# Patient Record
Sex: Female | Born: 1950 | Race: White | Hispanic: No | Smoking: Former smoker
Health system: Southern US, Community
[De-identification: ages and names within clinical notes are randomized; demographics above are authoritative.]

## PROBLEM LIST (undated history)

## (undated) DIAGNOSIS — I10 Essential (primary) hypertension: Secondary | ICD-10-CM

## (undated) DIAGNOSIS — E785 Hyperlipidemia, unspecified: Secondary | ICD-10-CM

## (undated) DIAGNOSIS — K219 Gastro-esophageal reflux disease without esophagitis: Secondary | ICD-10-CM

## (undated) DIAGNOSIS — G4700 Insomnia, unspecified: Secondary | ICD-10-CM

## (undated) DIAGNOSIS — F329 Major depressive disorder, single episode, unspecified: Secondary | ICD-10-CM

## (undated) HISTORY — PX: APPENDECTOMY: SHX54

## (undated) HISTORY — PX: ABDOMINAL HYSTERECTOMY: SHX81

## (undated) HISTORY — PX: TONSILLECTOMY: SUR1361

## (undated) HISTORY — PX: UPPER GASTROINTESTINAL ENDOSCOPY: SHX188

## (undated) HISTORY — DX: Major depressive disorder, single episode, unspecified: F32.9

## (undated) HISTORY — DX: Insomnia, unspecified: G47.00

## (undated) HISTORY — DX: Hyperlipidemia, unspecified: E78.5

## (undated) HISTORY — PX: TENDON REPAIR: SHX5111

## (undated) HISTORY — PX: CHOLECYSTECTOMY: SHX55

## (undated) HISTORY — PX: TONSILLECTOMY: SHX5217

## (undated) HISTORY — DX: Gastro-esophageal reflux disease without esophagitis: K21.9

## (undated) HISTORY — PX: ORTHOPEDIC SURGERY: SHX850

---

## 2000-12-14 ENCOUNTER — Encounter: Payer: Self-pay | Admitting: Otolaryngology

## 2000-12-14 ENCOUNTER — Encounter: Admission: RE | Admit: 2000-12-14 | Discharge: 2000-12-14 | Payer: Self-pay | Admitting: Otolaryngology

## 2001-03-01 ENCOUNTER — Encounter: Payer: Self-pay | Admitting: Family Medicine

## 2001-03-01 ENCOUNTER — Ambulatory Visit (HOSPITAL_COMMUNITY): Admission: RE | Admit: 2001-03-01 | Discharge: 2001-03-01 | Payer: Self-pay | Admitting: Family Medicine

## 2002-05-12 ENCOUNTER — Encounter: Payer: Self-pay | Admitting: Obstetrics and Gynecology

## 2002-05-15 ENCOUNTER — Encounter (INDEPENDENT_AMBULATORY_CARE_PROVIDER_SITE_OTHER): Payer: Self-pay | Admitting: Specialist

## 2002-05-15 ENCOUNTER — Inpatient Hospital Stay (HOSPITAL_COMMUNITY): Admission: RE | Admit: 2002-05-15 | Discharge: 2002-05-17 | Payer: Self-pay | Admitting: Obstetrics and Gynecology

## 2002-05-16 ENCOUNTER — Encounter: Payer: Self-pay | Admitting: Obstetrics and Gynecology

## 2005-03-28 ENCOUNTER — Encounter: Admission: RE | Admit: 2005-03-28 | Discharge: 2005-03-28 | Payer: Self-pay | Admitting: Obstetrics and Gynecology

## 2005-07-11 ENCOUNTER — Ambulatory Visit: Payer: Self-pay | Admitting: Internal Medicine

## 2005-07-26 ENCOUNTER — Ambulatory Visit (HOSPITAL_COMMUNITY): Admission: RE | Admit: 2005-07-26 | Discharge: 2005-07-26 | Payer: Self-pay | Admitting: Internal Medicine

## 2005-07-26 ENCOUNTER — Ambulatory Visit: Payer: Self-pay | Admitting: Internal Medicine

## 2005-07-26 ENCOUNTER — Encounter (INDEPENDENT_AMBULATORY_CARE_PROVIDER_SITE_OTHER): Payer: Self-pay | Admitting: *Deleted

## 2006-05-10 ENCOUNTER — Encounter: Admission: RE | Admit: 2006-05-10 | Discharge: 2006-05-10 | Payer: Self-pay | Admitting: Obstetrics and Gynecology

## 2007-05-28 ENCOUNTER — Encounter: Admission: RE | Admit: 2007-05-28 | Discharge: 2007-05-28 | Payer: Self-pay | Admitting: Obstetrics and Gynecology

## 2008-06-30 ENCOUNTER — Encounter: Admission: RE | Admit: 2008-06-30 | Discharge: 2008-06-30 | Payer: Self-pay | Admitting: Obstetrics and Gynecology

## 2008-12-29 ENCOUNTER — Emergency Department (HOSPITAL_COMMUNITY): Admission: EM | Admit: 2008-12-29 | Discharge: 2008-12-29 | Payer: Self-pay | Admitting: Emergency Medicine

## 2008-12-29 ENCOUNTER — Encounter (INDEPENDENT_AMBULATORY_CARE_PROVIDER_SITE_OTHER): Payer: Self-pay | Admitting: *Deleted

## 2008-12-30 ENCOUNTER — Ambulatory Visit (HOSPITAL_COMMUNITY): Admission: RE | Admit: 2008-12-30 | Discharge: 2008-12-30 | Payer: Self-pay | Admitting: Family Medicine

## 2008-12-30 ENCOUNTER — Encounter: Payer: Self-pay | Admitting: Internal Medicine

## 2009-01-01 ENCOUNTER — Encounter (INDEPENDENT_AMBULATORY_CARE_PROVIDER_SITE_OTHER): Payer: Self-pay | Admitting: *Deleted

## 2009-01-29 ENCOUNTER — Ambulatory Visit: Payer: Self-pay | Admitting: Internal Medicine

## 2009-01-29 DIAGNOSIS — R932 Abnormal findings on diagnostic imaging of liver and biliary tract: Secondary | ICD-10-CM | POA: Insufficient documentation

## 2009-01-29 DIAGNOSIS — Z860100 Personal history of colon polyps, unspecified: Secondary | ICD-10-CM | POA: Insufficient documentation

## 2009-01-29 DIAGNOSIS — R1011 Right upper quadrant pain: Secondary | ICD-10-CM

## 2009-02-02 ENCOUNTER — Ambulatory Visit (HOSPITAL_COMMUNITY): Admission: RE | Admit: 2009-02-02 | Discharge: 2009-02-02 | Payer: Self-pay | Admitting: Internal Medicine

## 2009-07-26 ENCOUNTER — Encounter: Admission: RE | Admit: 2009-07-26 | Discharge: 2009-07-26 | Payer: Self-pay | Admitting: Obstetrics and Gynecology

## 2010-01-15 ENCOUNTER — Emergency Department (HOSPITAL_COMMUNITY)
Admission: EM | Admit: 2010-01-15 | Discharge: 2010-01-15 | Payer: Self-pay | Source: Home / Self Care | Admitting: Emergency Medicine

## 2010-02-16 ENCOUNTER — Ambulatory Visit: Payer: Self-pay | Admitting: Internal Medicine

## 2010-03-23 ENCOUNTER — Ambulatory Visit: Admit: 2010-03-23 | Payer: Self-pay | Admitting: Internal Medicine

## 2010-03-23 ENCOUNTER — Encounter (HOSPITAL_BASED_OUTPATIENT_CLINIC_OR_DEPARTMENT_OTHER): Payer: 59 | Admitting: Internal Medicine

## 2010-03-23 ENCOUNTER — Other Ambulatory Visit (INDEPENDENT_AMBULATORY_CARE_PROVIDER_SITE_OTHER): Payer: Self-pay | Admitting: Internal Medicine

## 2010-03-23 ENCOUNTER — Inpatient Hospital Stay (HOSPITAL_COMMUNITY): Admit: 2010-03-23 | Payer: Self-pay

## 2010-03-23 ENCOUNTER — Other Ambulatory Visit: Payer: Self-pay

## 2010-03-23 ENCOUNTER — Ambulatory Visit (HOSPITAL_COMMUNITY)
Admission: RE | Admit: 2010-03-23 | Discharge: 2010-03-23 | Disposition: A | Discharge status: Home / Self Care | Payer: 59 | Attending: Internal Medicine | Admitting: Internal Medicine

## 2010-03-23 DIAGNOSIS — Z860100 Personal history of colon polyps, unspecified: Secondary | ICD-10-CM | POA: Insufficient documentation

## 2010-03-23 DIAGNOSIS — K5730 Diverticulosis of large intestine without perforation or abscess without bleeding: Secondary | ICD-10-CM | POA: Insufficient documentation

## 2010-03-23 DIAGNOSIS — R112 Nausea with vomiting, unspecified: Secondary | ICD-10-CM | POA: Insufficient documentation

## 2010-03-23 DIAGNOSIS — K2960 Other gastritis without bleeding: Secondary | ICD-10-CM

## 2010-03-23 DIAGNOSIS — K644 Residual hemorrhoidal skin tags: Secondary | ICD-10-CM | POA: Insufficient documentation

## 2010-03-23 DIAGNOSIS — D126 Benign neoplasm of colon, unspecified: Secondary | ICD-10-CM | POA: Insufficient documentation

## 2010-03-23 DIAGNOSIS — K648 Other hemorrhoids: Secondary | ICD-10-CM | POA: Insufficient documentation

## 2010-03-23 DIAGNOSIS — K2940 Chronic atrophic gastritis without bleeding: Secondary | ICD-10-CM | POA: Insufficient documentation

## 2010-03-28 ENCOUNTER — Ambulatory Visit (HOSPITAL_COMMUNITY)
Admission: RE | Admit: 2010-03-28 | Discharge: 2010-03-28 | Disposition: A | Discharge status: Home / Self Care | Payer: 59 | Source: Ambulatory Visit | Attending: Family Medicine | Admitting: Family Medicine

## 2010-03-28 DIAGNOSIS — R109 Unspecified abdominal pain: Secondary | ICD-10-CM | POA: Insufficient documentation

## 2010-03-28 DIAGNOSIS — R112 Nausea with vomiting, unspecified: Secondary | ICD-10-CM | POA: Insufficient documentation

## 2010-03-28 DIAGNOSIS — K7689 Other specified diseases of liver: Secondary | ICD-10-CM | POA: Insufficient documentation

## 2010-04-04 ENCOUNTER — Other Ambulatory Visit (INDEPENDENT_AMBULATORY_CARE_PROVIDER_SITE_OTHER): Payer: Self-pay | Admitting: Internal Medicine

## 2010-04-04 DIAGNOSIS — R112 Nausea with vomiting, unspecified: Secondary | ICD-10-CM

## 2010-04-04 NOTE — Op Note (Signed)
NAMEADRIEANA, Janice Stephens               ACCOUNT NO.:  [id number]  MEDICAL RECORD NO.:  [id number]           PATIENT TYPE:  O  LOCATION:  DAY                           FACILITY:  [facility]  PHYSICIAN:  Lionel December, M.D.    DATE OF BIRTH:  Sep 07, 1950  DATE OF PROCEDURE:  03/23/2010 DATE OF DISCHARGE:                              OPERATIVE REPORT   PROCEDURE:  Diagnostic esophagogastroduodenoscopy and surveillance colonoscopy.  INDICATIONS:  Janice Stephens is 60 year old Caucasian female with unexplained nausea at times, worse with meals and sporadic vomiting, who in the past was also having chest pain, but cardiac evaluation as well as hepatobiliary ultrasound was negative.  She has chronic GERD and maintained on double dose PPI.  Her heartburn is well-controlled, but not her nausea.  She is therefore undergoing diagnostic EGD.  She states she has been treated for H. pylori gastritis in the past.  She has history of colonic polyps.  She had two adenomas removed by Dr. Jena Gauss in June 2007, and therefore undergoing surveillance colonoscopy. Procedures were reviewed with the patient.  Informed consent was obtained.  MEDS FOR CONSCIOUS SEDATION:  Cetacaine spray for oropharyngeal topical anesthesia, Demerol 50 mg IV, Versed 12 mg IV.  FINDINGS:  Procedure performed in endoscopy suite.  The patient's vital signs and O2 sat were monitored during the procedure and remained stable.  PROCEDURES: 1. Esophagogastroduodenoscopy.  The patient was placed in left lateral     recumbent position and Pentax videoscope was passed through     oropharynx without any difficulty into esophagus. 2. Esophagus.  Mucosa of the esophagus normal.  GE junction was     located at 37 cm from the incisors and was unremarkable. 3. Stomach.  It was empty and distended very well by insufflation.     Folds of proximal stomach are normal.  The gastric body, there was     patchy edema and erythema with few areas covered with specks  blood,     one such area was washed revealing underlying erosion.  Erosion was     approximately 4 x 8 mm.  Biopsy was taken from this area.  Antral     mucosa was normal.  Pyloric channel was patent.  Angularis, fundus,     and cardia were examined by retroflexion of the scope and were     normal. 4. Duodenum.  Bulbar mucosa was normal.  Scope was passed in second     part of duodenal mucosa and folds were normal.  Endoscope was     withdrawn.  The patient prepared for procedure #2. 5. Colonoscopy.  Rectal examination performed.  No abnormality noted     on external or digital exam.  Pentax videoscope was placed in     rectum and advanced under vision into sigmoid colon and beyond.     Preparation was excellent.  She had few small diverticula at the     transverse and ascending colon.  Scope was passed to the region of     ileocecal valve and cecum.  Cecal mucosa seen from a distance, but     could not  get closer to it.  Changing positions and withdrawing the     scope back all the way and starting again did not help.  As the     scope was withdrawn, colonic mucosa was carefully examined.  A 6-mm     polyp was snared from proximal transverse colon and 7-8 mm polyp on     a short stalk was snared from proximal sigmoid colon.  Rest of the     mucosa was normal.  Rectal mucosa was also normal.  Scope was     retroflexed to examine anorectal junction and hemorrhoids noted     above and below the dentate line.  Endoscope was then withdrawn.     Withdrawal time was over 40 minutes.  The patient tolerated the     procedure well.  FINAL DIAGNOSES: 1. Erosive gastritis.  Gastritis is primarily limited to gastric body,     biopsy taken for routine histology. 2. No evidence of pyloric stenosis or peptic ulcer disease. 3. Colonoscopy performed to cecum, but blunt end was not well seen.  A     6-mm polyp snared from transverse colon and 7-8 mm polyp snared     from proximal sigmoid colon. 4. Few  small diverticula at ascending and transverse colon. 5. Internal/external hemorrhoids.  RECOMMENDATIONS:  No aspirin for 10 days.  She will continue antireflux medicine and PPI.  She will also continue high-fiber diet, fiber supplement, and take Colace 2 tablets daily.  I will be contacting the patient with results of biopsy and further recommendations.     Lionel December, M.D.     NR/MEDQ  D:  03/23/2010  T:  03/23/2010  Job:  [id number]  cc:   Kirk Ruths, M.D. Fax: [phone]  Electronically Signed by Lionel December M.D. on 04/04/2010 11:14:49 AM

## 2010-04-07 ENCOUNTER — Encounter (HOSPITAL_COMMUNITY)
Admission: RE | Admit: 2010-04-07 | Discharge: 2010-04-07 | Disposition: A | Discharge status: Home / Self Care | Payer: 59 | Source: Ambulatory Visit | Attending: Internal Medicine | Admitting: Internal Medicine

## 2010-04-07 ENCOUNTER — Encounter (HOSPITAL_COMMUNITY): Payer: Self-pay

## 2010-04-07 DIAGNOSIS — R112 Nausea with vomiting, unspecified: Secondary | ICD-10-CM | POA: Insufficient documentation

## 2010-04-07 DIAGNOSIS — R109 Unspecified abdominal pain: Secondary | ICD-10-CM | POA: Insufficient documentation

## 2010-04-07 MED ORDER — TECHNETIUM TC 99M MEBROFENIN IV KIT
5.0000 | PACK | Freq: Once | INTRAVENOUS | Status: AC | PRN
  Administered 2010-04-07: 5.34 via INTRAVENOUS

## 2010-04-11 ENCOUNTER — Other Ambulatory Visit (INDEPENDENT_AMBULATORY_CARE_PROVIDER_SITE_OTHER): Payer: Self-pay | Admitting: Internal Medicine

## 2010-04-11 DIAGNOSIS — R1011 Right upper quadrant pain: Secondary | ICD-10-CM

## 2010-04-15 ENCOUNTER — Ambulatory Visit (HOSPITAL_COMMUNITY)
Admission: RE | Admit: 2010-04-15 | Discharge: 2010-04-15 | Disposition: A | Discharge status: Home / Self Care | Payer: 59 | Source: Ambulatory Visit | Attending: Internal Medicine | Admitting: Internal Medicine

## 2010-04-15 ENCOUNTER — Encounter (HOSPITAL_COMMUNITY): Payer: Self-pay

## 2010-04-15 DIAGNOSIS — R112 Nausea with vomiting, unspecified: Secondary | ICD-10-CM | POA: Insufficient documentation

## 2010-04-15 DIAGNOSIS — K5900 Constipation, unspecified: Secondary | ICD-10-CM | POA: Insufficient documentation

## 2010-04-15 DIAGNOSIS — R1011 Right upper quadrant pain: Secondary | ICD-10-CM | POA: Insufficient documentation

## 2010-04-15 MED ORDER — IOHEXOL 300 MG/ML  SOLN
100.0000 mL | Freq: Once | INTRAMUSCULAR | Status: AC | PRN
  Administered 2010-04-15: 100 mL via INTRAVENOUS

## 2010-04-19 ENCOUNTER — Ambulatory Visit (INDEPENDENT_AMBULATORY_CARE_PROVIDER_SITE_OTHER): Payer: 59 | Admitting: Internal Medicine

## 2010-04-25 ENCOUNTER — Ambulatory Visit (INDEPENDENT_AMBULATORY_CARE_PROVIDER_SITE_OTHER): Payer: 59 | Admitting: Internal Medicine

## 2010-04-25 DIAGNOSIS — R1011 Right upper quadrant pain: Secondary | ICD-10-CM

## 2010-04-25 DIAGNOSIS — R110 Nausea: Secondary | ICD-10-CM

## 2010-05-03 LAB — CBC
HCT: 40.2 % (ref 36.0–46.0)
Hemoglobin: 13 g/dL (ref 12.0–15.0)
RBC: 4.74 MIL/uL (ref 3.87–5.11)
WBC: 8.8 10*3/uL (ref 4.0–10.5)

## 2010-05-03 LAB — COMPREHENSIVE METABOLIC PANEL
ALT: 18 U/L (ref 0–35)
AST: 17 U/L (ref 0–37)
Alkaline Phosphatase: 99 U/L (ref 39–117)
CO2: 25 mEq/L (ref 19–32)
GFR calc non Af Amer: >60 mL/min (ref >60)
Glucose, Bld: 92 mg/dL (ref 70–99)
Potassium: 3.2 mEq/L — ABNORMAL LOW (ref 3.5–5.1)
Sodium: 139 mEq/L (ref 135–145)

## 2010-05-03 LAB — POCT CARDIAC MARKERS
CKMB, poc: 1.1 ng/mL (ref 1.0–8.0)
Troponin i, poc: <0.05 ng/mL (ref 0.00–0.09)

## 2010-05-04 ENCOUNTER — Other Ambulatory Visit: Payer: Self-pay | Admitting: General Surgery

## 2010-05-04 ENCOUNTER — Encounter (HOSPITAL_COMMUNITY): Payer: 59

## 2010-05-04 LAB — BASIC METABOLIC PANEL
Calcium: 9.5 mg/dL (ref 8.4–10.5)
GFR calc Af Amer: >60 mL/min (ref >60)
GFR calc non Af Amer: >60 mL/min (ref >60)
Glucose, Bld: 98 mg/dL (ref 70–99)
Potassium: 3.6 mEq/L (ref 3.5–5.1)
Sodium: 141 mEq/L (ref 135–145)

## 2010-05-04 LAB — HEPATIC FUNCTION PANEL
ALT: 19 U/L (ref 0–35)
AST: 17 U/L (ref 0–37)
Albumin: 4.2 g/dL (ref 3.5–5.2)
Bilirubin, Direct: 0.2 mg/dL (ref 0.0–0.3)
Total Protein: 6.7 g/dL (ref 6.0–8.3)

## 2010-05-04 LAB — CBC
HCT: 42 % (ref 36.0–46.0)
Hemoglobin: 14.2 g/dL (ref 12.0–15.0)
RDW: 13.5 % (ref 11.5–15.5)
WBC: 10.2 10*3/uL (ref 4.0–10.5)

## 2010-05-04 LAB — SURGICAL PCR SCREEN: MRSA, PCR: NEGATIVE

## 2010-05-05 NOTE — H&P (Signed)
  NAME:  Janice Stephens, Janice Stephens               ACCOUNT NO.:  [id number]  MEDICAL RECORD NO.:  [id number]           PATIENT TYPE:  O  LOCATION:  DAY                           FACILITY:  [facility]  PHYSICIAN:  Dalia Heading, M.D.  DATE OF BIRTH:  1950-05-30  DATE OF ADMISSION: DATE OF DISCHARGE:  LH                             HISTORY & PHYSICAL   CHIEF COMPLAINT:  Chronic cholecystitis.  HISTORY OF PRESENT ILLNESS:  The patient is a 60 year old white female who is referred for evaluation and treatment of biliary colic.  She has been having intermittent right upper quadrant abdominal pain with radiation to the flank, nausea, and indigestion for the past few months. An extensive workup has been done and is remarkable only for reproducible symptoms with CCK injection.  It is made worse with fatty foods.  No fever, chills, jaundice have been noted.  PAST MEDICAL HISTORY:  Reflux disease.  PAST SURGICAL HISTORY:  Appendectomy, knee surgeries, elbow surgery, hysterectomy, tonsillectomy.  CURRENT MEDICATIONS:  Wellbutrin, alprazolam, Prevacid, Zofran.  ALLERGIES:  No known drug allergies.  REVIEW OF SYSTEMS:  The patient denies drinking or significant alcohol use.  She denies any other cardiopulmonary difficulties or bleeding disorders.  FAMILY MEDICAL HISTORY:  Noncontributory.  PHYSICAL EXAMINATION:  GENERAL:  The patient is a well-developed, well- nourished white female in no acute distress. HEENT:  Reveals no scleral icterus. LUNGS:  Clear to auscultation with equal breath sounds bilaterally. HEART:  Reveals regular rate and rhythm without S3, S4, murmurs. ABDOMEN:  Soft and nondistended.  She is tender in the right upper quadrant to palpation.  No hepatosplenomegaly, masses, hernias are identified.  Ultrasound of the gallbladder is negative.  HIDA scan reveals a normal ejection fraction but reproducible symptoms with CCK.  IMPRESSION:  Chronic cholecystitis.  PLAN:  The patient is  scheduled for laparoscopic cholecystectomy on May 09, 2010.  The risks and benefits of the procedure including bleeding, infection, hepatobiliary injury, the possibility of an open procedure were fully explained to the patient, gave informed consent. She fully understands that this procedure may not totally resolve her symptoms.     Dalia Heading, M.D.     MAJ/MEDQ  D:  05/03/2010  T:  05/04/2010  Job:  [id number]  cc:   Lionel December, M.D. Fax: [phone]  Kirk Ruths, M.D. Fax: [phone]  Electronically Signed by [name] M.D. on 05/05/2010 10:59:11 AM

## 2010-05-09 ENCOUNTER — Other Ambulatory Visit: Payer: Self-pay | Admitting: General Surgery

## 2010-05-09 ENCOUNTER — Ambulatory Visit (HOSPITAL_COMMUNITY)
Admission: RE | Admit: 2010-05-09 | Discharge: 2010-05-09 | Disposition: A | Discharge status: Home / Self Care | Payer: 59 | Source: Ambulatory Visit | Attending: General Surgery | Admitting: General Surgery

## 2010-05-09 DIAGNOSIS — Z01818 Encounter for other preprocedural examination: Secondary | ICD-10-CM | POA: Insufficient documentation

## 2010-05-09 DIAGNOSIS — K811 Chronic cholecystitis: Secondary | ICD-10-CM | POA: Insufficient documentation

## 2010-05-09 DIAGNOSIS — Z01812 Encounter for preprocedural laboratory examination: Secondary | ICD-10-CM | POA: Insufficient documentation

## 2010-05-10 NOTE — Op Note (Signed)
[name], Janice Stephens               ACCOUNT NO.:  [id number]  MEDICAL RECORD NO.:  [id number]           PATIENT TYPE:  O  LOCATION:  DAYP                          FACILITY:  [facility]  PHYSICIAN:  Dalia Heading, M.D.  DATE OF BIRTH:  1950-03-18  DATE OF PROCEDURE:  05/09/2010 DATE OF DISCHARGE:                              OPERATIVE REPORT   PREOPERATIVE DIAGNOSIS:  Chronic cholecystitis.  POSTOPERATIVE DIAGNOSIS:  Chronic cholecystitis.  PROCEDURE:  Laparoscopic cholecystectomy.  SURGEON:  Dalia Heading, MD  ANESTHESIA:  General endotracheal.  INDICATIONS:  The patient is a 60 year old white female who was referred for treatment of biliary colic.  The risks and benefits of the procedure including bleeding, infection, hepatobiliary injury, and the possibility of an open procedure were fully explained to the patient, gave informed consent.  PROCEDURE NOTE:  The patient was placed in supine position.  After induction of general endotracheal anesthesia, the abdomen was prepped and draped in the usual sterile technique with DuraPrep.  Surgical site confirmation was performed.  An infraumbilical incision was made down to the fascia.  A Veress needle was introduced into the abdominal cavity and confirmation of placement was done using the saline drop test.  The abdomen was then insufflated to 16 mmHg pressure.  An 11-mm trocar was introduced into the abdominal cavity under direct visualization without difficulty.  The patient was placed in reversed Trendelenburg position.  Additional 11-mm trocar was placed epigastric region and 5-mm trocars were placed in the right upper quadrant and right flank regions.  Liver was inspected and noted to be within normal limits.  The gallbladder was retracted superiorly and laterally.  The dissection was begun around the infundibulum of the gallbladder.  The cystic duct was first identified.  Its juncture to the infundibulum were fully  identified.  EndoClips were placed proximally and distally on the cystic duct and cystic duct was divided.  This likewise done on the cystic artery.  The gallbladder was then freed away from the gallbladder fossa using Bovie electrocautery.  The gallbladder was delivered through the epigastric trocar site using EndoCatch bag. The gallbladder fossa was inspected.  No abnormal bleeding or bile leakage was noted.  Surgicel was placed in the gallbladder fossa.  All fluid and air were then evacuate from the abdominal cavity prior to removal of the trocars.  All wounds were irrigated with normal saline.  All wounds were checked with 0.5 cm Sensorcaine.  The infraumbilical fascia was reapproximated using an 0 Vicryl interrupted suture.  All skin incisions were closed using staples.  Betadine ointment, dry sterile dressings were applied.  All tape and needle counts correct at the end of the procedure.  The patient was extubated in the operating room and went back to recovery room, awake in stable condition.  COMPLICATIONS:  None.  SPECIMEN:  Gallbladder.  BLOOD LOSS:  Minimal.     Dalia Heading, M.D.     MAJ/MEDQ  D:  05/09/2010  T:  05/09/2010  Job:  [id number]  cc:   Kirk Ruths, M.D. Fax: [phone]  Lionel December, M.D. Fax: [phone]  Electronically Signed  by [name] M.D. on 05/10/2010 09:01:21 AM

## 2010-05-25 LAB — COMPREHENSIVE METABOLIC PANEL
Albumin: 3.8 g/dL (ref 3.5–5.2)
Alkaline Phosphatase: 84 U/L (ref 39–117)
BUN: 13 mg/dL (ref 6–23)
CO2: 23 mEq/L (ref 19–32)
Chloride: 106 mEq/L (ref 96–112)
Creatinine, Ser: 0.76 mg/dL (ref 0.4–1.2)
GFR calc non Af Amer: >60 mL/min (ref >60)
Glucose, Bld: 108 mg/dL — ABNORMAL HIGH (ref 70–99)
Potassium: 3.7 mEq/L (ref 3.5–5.1)
Total Bilirubin: 0.8 mg/dL (ref 0.3–1.2)

## 2010-05-25 LAB — CBC
HCT: 40.5 % (ref 36.0–46.0)
Hemoglobin: 14 g/dL (ref 12.0–15.0)
MCV: 87 fL (ref 78.0–100.0)
RBC: 4.65 MIL/uL (ref 3.87–5.11)
WBC: 8.1 10*3/uL (ref 4.0–10.5)

## 2010-05-25 LAB — POCT CARDIAC MARKERS
CKMB, poc: <1 ng/mL — ABNORMAL LOW (ref 1.0–8.0)
Myoglobin, poc: 44.7 ng/mL (ref 12–200)

## 2010-05-25 LAB — LIPASE, BLOOD: Lipase: 24 U/L (ref 11–59)

## 2010-05-25 LAB — DIFFERENTIAL
Basophils Absolute: 0.1 10*3/uL (ref 0.0–0.1)
Basophils Relative: 2 % — ABNORMAL HIGH (ref 0–1)
Lymphocytes Relative: 24 % (ref 12–46)
Monocytes Absolute: 0.1 10*3/uL (ref 0.1–1.0)
Neutro Abs: 5.6 10*3/uL (ref 1.7–7.7)

## 2010-06-13 ENCOUNTER — Other Ambulatory Visit (INDEPENDENT_AMBULATORY_CARE_PROVIDER_SITE_OTHER): Payer: Self-pay | Admitting: Internal Medicine

## 2010-06-13 DIAGNOSIS — R112 Nausea with vomiting, unspecified: Secondary | ICD-10-CM

## 2010-06-20 ENCOUNTER — Other Ambulatory Visit: Payer: Self-pay | Admitting: Obstetrics and Gynecology

## 2010-06-20 DIAGNOSIS — Z1231 Encounter for screening mammogram for malignant neoplasm of breast: Secondary | ICD-10-CM

## 2010-07-08 NOTE — Discharge Summary (Signed)
NAME:  Janice Stephens, Janice Stephens                         ACCOUNT NO.:  [id number]   MEDICAL RECORD NO.:  [id number]                   PATIENT TYPE:  INP   LOCATION:  0477                                 FACILITY:  [facility]   PHYSICIAN:  Malachi Pro. Ambrose Mantle, M.D.              DATE OF BIRTH:  1950/08/06   DATE OF ADMISSION:  05/15/2002  DATE OF DISCHARGE:  05/17/2002                                 DISCHARGE SUMMARY   HISTORY OF PRESENT ILLNESS:  This is a 60 year old white female who is  admitted for abdominal hysterectomy, bilateral salpingo-oophorectomy,  because of menorrhagia, dysmenorrhea, abnormal uterine bleeding, and  fibroids.  At the time of laparotomy the patient was noted to have adhesions  of her omentum to her abdominal wall and the sigmoid colon to the abdominal  wall.  She was also noted to have pelvic endometriosis along the left ureter  and lateral to the right uterosacral ligament, and an enlarged appendix.   HOSPITAL COURSE:  She underwent abdominal hysterectomy, bilateral salpingo-  oophorectomy, and appendectomy by Dr. Ambrose Mantle with Dr. Senaida Ores assisting  under general anesthesia.  Dr. Derrell Lolling was called in on consultation to see  if he though the appendix should be removed, and he agreed that it should be  removed.  Postoperatively, the patient did quite well.  She did have a  temperature elevation to 100.5 degrees at 4 p.m. and 8 p.m. on the first  postoperative day.  Her only complaint was some pain at the lower right rib  cage, and a chest x-ray showed no acute infiltrate and showed air under the  right hemidiaphragm compatible with recent surgery, and that was considered  to be the cause of her pain.  An urine specimen was normal.  Her abdomen was  soft and nontender.  Incision looked like it was healing well.  No sign of  infection.  She had passed some flatus, and on the second postoperative day  she was ready for discharge.   LABORATORY DATA:  Admission hemoglobin  14.1, hematocrit 40.4, white blood  cell count 7700, platelet count 235,000.  Followup hematocrits were 36.9 and  37, 68 segs, 24 lymphs, 4 monos, 3 eosinophils.  Comprehensive metabolic  profile was normal.  Urinalysis on admission was unremarkable, but a cathed  urine for evaluation of low-grade fever on 05/16/02, showed no abnormalities.  A culture is pending.  Chest x-ray on admission showed no active disease.  A  chest x-ray to evaluate a low-grade fever on the first postoperative day  showed no acute infiltrate, but air under the right hemidiaphragm compatible  with recent surgery.  EKG was normal.  Pathology report is not available at  this time.   FINAL DIAGNOSES:  1. Menorrhagia.  2. Dysmenorrhea.  3. Abnormal uterine bleeding.  4. Abdominal adhesions of unknown origin unless secondary to her previous     tubal surgery.  5. Probable fecalith  of the appendix.  6. Pelvic endometriosis.   Pathology report is pending.   OPERATION:  Abdominal hysterectomy, bilateral salpingo-oophorectomy,  appendectomy, and division of abdominal adhesions.   CONDITION ON DISCHARGE:  Improved.   DISCHARGE INSTRUCTIONS:  1. Our regular discharge instructions.  2. No intercourse, no vaginal entrance, no heavy lifting or strenuous     activity.  3. Watch her temperature.  4. Call with any fever above 100.6 degrees.  Call with any heavy vaginal     bleeding.  5. Concentrate on a liquid diet until freely passing flatus or having bowel     movements.   FOLLOWUP:  Return to the office in two to three days for staple removal.   DISCHARGE MEDICATIONS:  Percocet 5/325 mg #16 tablets one q.4-6h. p.r.n.  pain.                                                Malachi Pro. Ambrose Mantle, M.D.    TFH/MEDQ  D:  05/17/2002  T:  05/17/2002  Job:  [id number]

## 2010-07-08 NOTE — H&P (Signed)
NAME:  Janice Stephens, Janice Stephens                         ACCOUNT NO.:  [id number]   MEDICAL RECORD NO.:  [id number]                   PATIENT TYPE:  INP   LOCATION:  NA                                   FACILITY:  [facility]   PHYSICIAN:  Malachi Pro. Ambrose Mantle, M.D.              DATE OF BIRTH:  1951/02/01   DATE OF ADMISSION:  05/15/2002  DATE OF DISCHARGE:                                HISTORY & PHYSICAL   PRESENT ILLNESS:  This is a 60 year old white married female, para 0, who is  admitted to the hospital for abdominal hysterectomy, bilateral salpingo-  oophorectomy because of menorrhagia, dysmenorrhea, fibroids, abnormal  uterine bleeding.  This patient was seen on April 30, 2002.  At that time  she gave a history that her periods were extremely painful.  They were  irregular.  There was extremely heavy bleeding.  She claimed that on  March 26, 2002, she had had a period that lasted three to four days of  heavy flow, passing blood clots the size of the palm of her hand.  By  history, she said that her menarche was at age 50-18.  She had two periods  per year until her late 30s, but over the last 10-12 years she had had  regular periods.  One year ago she began having heavier periods every 30  days.  She would spot two days, bleed heavily for four to five days, and  then spot two more days.  She rated the pain as 8/10.  She would go through  a heavy pad at night, and she would palm-sized clots.  On examination her  uterus could be felt in the lower part of her abdomen and by pelvic exam was  thought to be about 10-12 weeks' size.  Because of the abnormal uterine  bleeding, a biopsy was done which showed weakly secretory endometrium.  No  evidence of hyperplasia or malignancy.  She is admitted now for abdominal  hysterectomy, bilateral salpingo-oophorectomy.  She was offered Lupron,  possible D&C.  She chose to proceed with definitive therapy.   ALLERGIES:  SULFA.   MEDICATIONS:  Ambien on a  p.r.n. basis.  Zyrtec.  Celexa.   OPERATION:  1. Bilateral arthroscopic knee surgery.  2. Right elbow surgery for tendinitis.  3. Tubal ligation.  4. Tonsillectomy.   PAST MEDICAL HISTORY:  Arthritis of the spine.  No heart problems.  In  January 2003, she did have dysplasia of the cervix.   TOBACCO/ALCOHOL:  No alcohol.  Tobacco use, one pack per day.   REVIEW OF SYSTEMS:  She does have headaches.  She has urinary frequency.   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   FAMILY HISTORY:  Mother 68 with heart problems  and intestinal problems.  Father 67 with heart problems, melanoma, and also diabetic.  No sisters.  One brother living and well.   PHYSICAL EXAMINATION:  GENERAL:  Well-developed, well-nourished white  female.  VITAL SIGNS:  Weight 137 pounds.  Blood pressure 120/80, pulse of 78.  HEENT:  No cranial abnormalities.  Extraocular movements intact.  Nose and  pharynx clear.  NECK:  Supple.  Without thyromegaly.  HEART:  Normal size and sounds.  No murmurs.  LUNGS:  Clear to P&A.  BREASTS:  Soft, without masses.  ABDOMEN:  Soft.  There is a lower abdominal mass somewhat imprecise as to  how far into the lower abdomen it comes.  The vulva and vagina show  menstrual flow.  A Pap smear on April 30, 2002, was negative for  intraepithelial lesion, and it was satisfactory for evaluation.  The cervix  is clean.  The uterus is anterior, thought to be 12-14 weeks' size.  It is  tender to motion.  The adnexae are free of masses.  RECTAL:  Negative.   ADMISSION IMPRESSION:  1. Leiomyomata uteri.  2. Menorrhagia.  3. Dysmenorrhea.  4. Abnormal uterine bleeding.   PLAN:  The patient is admitted for abdominal hysterectomy and bilateral  salpingo-oophorectomy.  She has been informed of the risks of surgery   including but not limited to heart attack, stroke, pulmonary embolus, wound  disruption, hemorrhage with need for reoperation and/or transfusion, fistula  formation, nerve injury, intestinal obstruction.  She understands and agrees  to proceed with surgery.  She has also been informed that the surgery could  have an unpredictable impact on her sex drive.  She understands that.  She  states that her sex drive has been low recently because of the extremely  draining nature of her periods and the severe pain she experiences with the  periods.  She understands that there may be a loss of sex drive and that if  she does experience a loss of sex drive there is not an absolute cure for  that condition.  She understands and agrees to proceed.                                               Malachi Pro. Ambrose Mantle, M.D.    TFH/MEDQ  D:  05/15/2002  T:  05/15/2002  Job:  [id number]

## 2010-07-08 NOTE — Op Note (Signed)
NAME:  Janice Stephens, Janice Stephens               ACCOUNT NO.:  [id number]   MEDICAL RECORD NO.:  [id number]          PATIENT TYPE:  AMB   LOCATION:  DAY                           FACILITY:  [facility]   PHYSICIAN:  R. Roetta Sessions, M.D. DATE OF BIRTH:  1950-11-02   DATE OF PROCEDURE:  07/26/2005  DATE OF DISCHARGE:                                 OPERATIVE REPORT   PROCEDURES:  1.  EGD.  2.  Colonoscopy with snare polypectomy.   INDICATIONS FOR PROCEDURE:  The patient is a 60 year old lady with  longstanding gastroesophageal reflux disease symptoms.  She has been on over-  the-counter agents, including Prilosec and Zantac.  She took Prevpac for  positive Helicobacter pylori serologies.  While on Prevpac therapy, she had  significant improvement in her reflux symptoms.  She was given Prevacid  Solutabs through our office, but has not yet started them.  She has had  intermittent hematochezia.  EGD and colonoscopy are now being done.  This  procedure has been discussed with the patient at length.  The potential  risks, benefits, and alternatives have been reviewed.  Questions answered.  She is agreeable.  Please see documentation in the medical record.   PROCEDURE NOTE:  O2 saturation, blood pressure, and pulse and respirations  were monitored throughout the entire procedure.   CONSCIOUS SEDATION:  1.  IV Demerol and Versed in incremental doses.  2.  Cetacaine spray for topical pharyngeal anesthesia.   FINDINGS:  EGD examination of the tubular esophagus revealed no mucosal  abnormalities.  EG junction was easily traversed.   Stomach:  Gastric cavity was empty.  It insufflated well with air.  Thorough  examination of the gastric mucosa, including retroflexed view of the  proximal stomach and esophagogastric junction demonstrated a small hiatal  hernia and mottled, erythematous-appearing gastric mucosa involving  primarily the body.  No infiltrating process, erosion, or ulcer crater, or  other  abnormality was seen.  Pylorus was patent and easily traversed.  Examination of the bulb and second portion revealed no abnormalities.   THERAPEUTIC/DIAGNOSTIC MANEUVERS PERFORMED:  None.   The patient tolerated the procedure well and was prepared for colonoscopy.   Digital exam revealed no abnormalities.  Endoscopic findings:  Prep was  adequate .  Rectum:  Examination of the rectum mucosa including retroflexed  view of the anal verge revealed somewhat engorged internal hemorrhoids and a  7 mm polyp 10 cm from the anal verge.  Rectal mucosa otherwise appeared  normal.   Colon:  Colonic mucosa was surveyed from the rectosigmoid junction, through  the left, transverse, and right colon, to the appendiceal orifice, ileocecal  valve, and cecum.  These structures were well seen and photographed for the  record.  From this level, the scope was slowly withdrawn.  All previously  mentioned mucosal surfaces were again seen.  The patient had a 1 cm somewhat  angry-appearing polyp at 35 cm.  It was pedunculated, and she had also  pancolonic diverticula.  The remainder of the colonic mucosa appeared  normal.  The polyp at 35 cm was removed with a  snare and cautery and  recovered through the scope.  The polyp at 10 cm was also removed with snare  cautery and recovered through the scope.  The patient tolerated both  procedures well and was reactivated.   ENDOSCOPIC IMPRESSION:   EGD:  1.  Normal esophagus.  2.  Small hiatal hernia, patchy, mottled erythema of the gastric body.      Otherwise normal-appearing gastric mucosa.  Patent pylorus, duodenal      bulb, D1, D2.   COLONOSCOPY FINDINGS:  1.  Polyps in the rectum and sigmoid colon, as described above, status post      removal with snare.  2.  Internal hemorrhoids, engorged.  Pancolonic diverticula.  The remainder      of the colonic mucosa appeared normal.   I suspect the patient has bled from hemorrhoids.   RECOMMENDATIONS:  1.  No  aspirin or __________ medications for the next 10 days.  2.  Diverticulosis literature given to [name].  3.  A 10-day course of Anusol-HC suppositories, 1 per rectum at bedtime.  4.  Follow up on pathology.  5.  Further recommendations to follow.      Jonathon Bellows, M.D.  Electronically Signed     RMR/MEDQ  D:  07/26/2005  T:  07/26/2005  Job:  [id number]   cc:   Kirk Ruths, M.D.  Fax: [phone]

## 2010-07-08 NOTE — Op Note (Signed)
NAME:  Janice Stephens, Janice Stephens                         ACCOUNT NO.:  [id number]   MEDICAL RECORD NO.:  [id number]                   PATIENT TYPE:  INP   LOCATION:  0010                                 FACILITY:  [facility]   PHYSICIAN:  Malachi Pro. Ambrose Mantle, M.D.              DATE OF BIRTH:  1950-11-02   DATE OF PROCEDURE:  05/15/2002  DATE OF DISCHARGE:                                 OPERATIVE REPORT   PREOPERATIVE DIAGNOSES:  1. Menorrhagia.  2. Dysmenorrhea.  3. Abnormal uterine bleeding.  4. Leiomyomata uteri.   POSTOPERATIVE DIAGNOSES:  1. Menorrhagia.  2. Dysmenorrhea.  3. Abnormal uterine bleeding.  4. Leiomyomata uteri.  5. Adhesions of the omentum to the abdominal wall and the sigmoid colon to     the abdominal wall.  6. Pelvic endometriosis.  7. Enlarged appendix.   OPERATION:  1. Abdominal hysterectomy.  2. Bilateral salpingo-oophorectomy.  3. Lysis of adhesions.  4. Appendectomy.   CONSULT:  Angelia Mould. Derrell Lolling, M.D.   OPERATOR:  Malachi Pro. Ambrose Mantle, M.D.   ANESTHESIA:  General.   DESCRIPTION OF PROCEDURE:  The patient was brought to the operating room and  placed under satisfactory general anesthesia and placed in the frogleg  position.  The abdomen, vulva, vagina, and urethra were prepped with  Betadine solution.  A Foley catheter was inserted to straight drain.  Exam  revealed the uterus to be about 12 weeks size, anterior, and quite firm.  There were no adnexal masses.  The patient was placed supine.  The abdomen  was entered by a transverse incision that was carried in layers through the  skin, subcutaneous tissue, and fascia.  The fascia was separated from the  rectus muscles superiorly and inferiorly.  Rectus muscle was split in the  midline and opened vertically.  Exploration of the upper abdomen revealed  the gallbladder to be hard to feel.  I was not sure I ever felt it.  The  liver was smooth.  Both kidneys felt normal.  There were no definite upper  abdominal  abnormalities; however, the sigmoid colon was adherent to the left  lower abdominal wall.  These adhesions were divided.  There were also  omental adhesions to the abdominal wall at about the level of the umbilicus  and above.  I took down the adhesions I could get to, then explored the  pelvis.  The uterus was what I thought was 2-1/2 to 3 times normal size and  extremely firm.  There was a leiomyoma in the right broad ligament close to  the uterus.  The uterus itself had some fibroids, but the main enlargement  might have been due to adenomyosis.  There was some nodularity from  endometriosis lateral to the right uterosacral ligament, adhering the rectum  to the posterior cul-de-sac below the uterosacral ligaments at their  junction to the uterus.  There was also endometriosis in at least two  locations in the area of the left ureter.  Both tubes showed evidence of  tubal ligation, and the ovaries appeared normal.  Packs and retractor were  used for exposure.  The upper pedicles of the uterus were clamped across,  and the uterus was brought up into the operative field.  Both round  ligaments were divided with the Bovie, and the infundibulopelvic ligaments  were divided between clamps and doubly suture ligated.  The uterine vessels  bilaterally were skeletonized, clamped, cut, and suture ligated.  The  parametrial tissues were then clamped, cut, and suture ligated, and the  uterosacral ligaments were clamped, cut, suture ligated, then held.  The  right vaginal angle was entered as was the left, and then the uterus,tubes,  and ovaries were removed by transecting the upper vagina.  Vaginal angled  sutures were placed, and then the central portion of the cuff was closed  with interrupted figure-of-eight sutures of 0 Vicryl.  Hemostasis was  achieved.  Liberal irrigation confirmed hemostasis.  The uterosacral  ligaments were sutured together in the midline with two sutures of 0 Vicryl.   Reperitonealization was then done across the vaginal cuff.  Both ureters  were identified and found to be normal in course and caliber.  It was  elected not to remove the endometriosis lateral to the right uterosacral  ligament or the endometriosis over the left ureter since the ovaries were  removed.  The appendix had been noted to be enlarged in its mid portion.  The proximal part of the appendix was normal in size and shape, and the very  distal-most part was normal.  However, intervening was a very firm appendix  that was slightly discolored and was thought to represent maybe either a  fecalith or possibly endometriosis.  We did call Angelia Mould. Derrell Lolling, M.D. to  the operating room.  He looked at the appendix and felt that it should be  removed.  I had divided the mesoappendix with multiple bites and sutured it  with 3-0 Vicryl suture and tied with 2-0 Vicryl.  I then clamped across the  base of the appendix, creating a crushed area, put a 0 chromic tie across  this area, placed a clamp just proximal to it and divided the appendix  between the clamp and the tie.  I then buried the appendiceal stump beneath  a pursestring suture of 3-0 black silk.  I did touch the open lumen of the  appendix with a Bovie.  Hemostasis was complete.  Reinspection of the pelvis  revealed no abnormalities, no bleeding.  Packs and retractors were  completely removed.  The peritoneum was closed with a running suture of 0  Vicryl, rectus muscle reapproximated with interrupted 0 Vicryl, fascia with  two running sutures of 0 Vicryl, subcutaneous tissue with a running 3-0  Vicryl, and the skin was closed with automatic staples.  The patient seemed  to tolerate the procedure well.  Blood loss was about 75 mL according to the  anesthetist, and the patient was returned to recovery in satisfactory  condition.                                               Malachi Pro. Ambrose Mantle, M.D.   TFH/MEDQ  D:  05/15/2002  T:   05/16/2002  Job:  [id number]

## 2010-07-08 NOTE — Consult Note (Signed)
NAME:  PIERA, DOWNS               ACCOUNT NO.:  [id number]   MEDICAL RECORD NO.:  [id number]          PATIENT TYPE:  AMB   LOCATION:                                FACILITY:  [facility]   PHYSICIAN:  R. Roetta Sessions, M.D. DATE OF BIRTH:  1950/08/02   DATE OF CONSULTATION:  DATE OF DISCHARGE:                                   CONSULTATION   GASTROINTESTINAL CONSULT   REQUESTING PHYSICIAN:  Dr. Regino Schultze.   REASON FOR CONSULTATION:  Chronic refractory GERD/hoarseness.   HISTORY OF PRESENT ILLNESS:  Mrs. Mccorvey is a 60 year old Caucasian female  who was referred from Dr. Edison Simon office.  She states she has a several-  year history of chronic GERD symptoms including heartburn, water brash and  daily indigestion.  She complained of chronic hoarseness.  She was seen by  Melony Overly, PA-C and found of the H. pylori positive.  She was given a  Prevpac.  She states that this completely resolved her symptoms; however, 2  days after the discontinuation of the Prevpac, she began to have recurrent  symptoms as above.  She then started over-the-counter Prilosec 20 mg daily,  which did not seem to help her symptoms.  They are definitely made worse by  tomatoes, coffee, chocolate, caffeine, et Karie Soda.  Prior to this, she had  been taking Zantac over-the-counter a couple per day for about a month.  She  denies any dysphagia or odynophagia although she does report occasional  __________  sensation.  She denies any anorexia or abdominal pain.  She has  had normal to sometimes hard bowel movements every other day to every 3  days.  She does use an over-the-counter stool softener, which seems to work  well for her.  She has noticed a small to moderate amount of rectal bleeding  frequently over the last couple of years.  She denies any history of  diarrhea.   PAST MEDICAL HISTORY:  1.  Ankylosing spondylitis.  2.  Depression.  3.  Chronic GERD.  4.  Hypercholesterolemia.  5.  Tonsillectomy.  6.   Bilateral knee surgeries.  7.  Hysterectomy.  8.  Right elbow surgery.   CURRENT MEDICATIONS:  1.  Over-the-counter Equate brand allergy medicines, 2 at bedtime.  2.  Calcium 600 mg of vitamin D once daily.  3.  Centrum multivitamin daily.  4.  Premarin 0.625 mg daily.  5.  Prilosec 20 mg daily.  6.  Citalopram 40 mg daily.  7.  She completed a Prevpac 2 months ago.   ALLERGIES:  SULFA.   FAMILY HISTORY:  Positive adenomatous polyps in both her mother and father  who are age 19, and 110.  Her mother also has a history of hypertension and  depression.  Her father has a history of MI and melanoma, and diabetes  mellitus.  She has 1 healthy brother.   SOCIAL HISTORY:  Mrs. Klinedinst has been married for 7 years.  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  She reports a remote history of  tobacco use, quitting 1 year ago.  She denies any alcohol or drug use.   REVIEW OF SYSTEMS:  CONSTITUTIONAL:  Weight is stable, denies any fever and  does complain of occasional chills.  CARDIOVASCULAR:  She reports a normal  cardiac workup within the last year with a normal stress test.  PULMONARY:  Denies any shortness of breath dyspnea, cough or hemoptysis.  GI:  See HPI.   PHYSICAL EXAMINATION:  VITAL SIGNS:  Weight 139 pounds, height 52 inches,  temp 97.6, blood pressure 128/84 and pulse 72.  GENERAL:  Mrs. Shepler is a 60 year old well-developed, well-nourished  Caucasian female in no acute distress.  HEENT:  Sclerae are anicteric and nonicteric.  Conjunctivae are pink.  Oropharynx is pink and moist without any lesions.  NECK:  Supple without mass or thyromegaly.  CHEST:  Heart regular rate and rhythm with normal S1 and S2 without any  murmurs, clicks, rubs or gallops.  LUNGS:  Clear to auscultation bilaterally.  ABDOMEN:  Flat with positive bowel sounds x4.  No bruits auscultated.  Soft,  nontender and nondistended without any palpable mass or hepatosplenomegaly.  No  rebound tenderness or guarding.  EXTREMITIES:  Without clubbing or edema.  RECTAL:  Deferred.  SKIN:  Pink, warm and dry without rash or jaundice.   LABORATORY STUDIES:  She had a normal CBC on May 04, 2005.  She had a  complete metabolic panel, which was normal.  She had a TSH, which was normal  and H. pylori was positive.  She is status post treatment.   IMPRESSION:  Mrs. Hughson is a 60 year old Caucasian female with a several-  year history of chronic gastroesophageal reflux disease symptoms including  heartburn, water brash and indigestion on a daily basis.  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  She has failed over-the-  counter Prilosec and Zantac.  She responded nicely to Prevpac, as she was  found to be Helicobacter pylori positive.  She is going to need further  evaluation for her refractory gastroesophageal reflux disease symptoms and  to rule out complicated gastroesophageal reflux disease including the  development of Barrett's esophagus.  Since she responded so well to Prevpac,  we will start Prevacid and see if she has any relief with this.   She also has intermittent hematochezia over the last several years in small  to moderate volume.  She has never had a colonoscopy and therefore will need  to undergo colonoscopy to determine the source of her intermittent  hematochezia as well.   PLAN:  1.  EGD and a colonoscopy with Dr. Jena Gauss in the near future.  I have      discussed both of the procedures including the risks and benefits, which      include but are not limited to bleeding, infection, perforation, drug      reaction.  She agrees with this plan and consent will be obtained.  2.  She was given Prevacid SoluTabs 30 mg daily for 1 week, Prevacid      capsules 30 mg daily for 10 days and she is going to determine which 1      she likes better.  3.  Stop Prilosec.  4.  Further recommendations pending procedure.  We would like to thank  Melony Overly PA-C and Dr. Regino Schultze for allowing [place] to  participate in the care of Mrs. Greeley.      Nicholas Lose, N.P.      Jonathon Bellows, M.D.  Electronically Signed  KC/MEDQ  D:  07/11/2005  T:  07/11/2005  Job:  [id number]   cc:   Kirk Ruths, M.D.  Fax: [phone]

## 2010-07-29 ENCOUNTER — Ambulatory Visit
Admission: RE | Admit: 2010-07-29 | Discharge: 2010-07-29 | Disposition: A | Discharge status: Home / Self Care | Payer: 59 | Source: Ambulatory Visit | Attending: Obstetrics and Gynecology | Admitting: Obstetrics and Gynecology

## 2010-07-29 ENCOUNTER — Encounter: Payer: Self-pay | Admitting: Internal Medicine

## 2010-07-29 DIAGNOSIS — Z1231 Encounter for screening mammogram for malignant neoplasm of breast: Secondary | ICD-10-CM

## 2011-07-28 ENCOUNTER — Other Ambulatory Visit: Payer: Self-pay | Admitting: Family Medicine

## 2011-07-28 DIAGNOSIS — Z1231 Encounter for screening mammogram for malignant neoplasm of breast: Secondary | ICD-10-CM

## 2011-08-09 ENCOUNTER — Ambulatory Visit
Admission: RE | Admit: 2011-08-09 | Discharge: 2011-08-09 | Disposition: A | Discharge status: Home / Self Care | Payer: 59 | Source: Ambulatory Visit | Attending: Family Medicine | Admitting: Family Medicine

## 2011-08-09 DIAGNOSIS — Z1231 Encounter for screening mammogram for malignant neoplasm of breast: Secondary | ICD-10-CM

## 2011-11-17 IMAGING — NM NM HEPATO W/GB/PHARM/[PERSON_NAME]
2 series · 12 of 12 positions shown · non-contrast
Comparison: Ultrasound 03/28/2010.

CLINICAL DATA: Abdominal pain.  Nausea.  Vomiting.

NUCLEAR MEDICINE HEPATOBILIARY IMAGING WITH GALLBLADDER EF
TECHNIQUE: Sequential images of the abdomen were obtained [DATE]
minutes following intravenous administration of
radiopharmaceutical. After slow intravenous infusion of 1.4 uCg
Cholecystokinin, gallbladder ejection fraction was determined.
Radiopharmaceutical: 5.3 mCi 8c-NNm Choletec

[hida · 3.20mm/px · 6 of 30 frames shown (1 of 2)]
[frame 3/30]
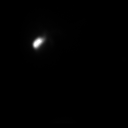
[frame 8/30]
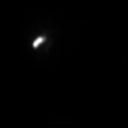
[frame 13/30]
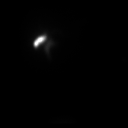
[frame 18/30]
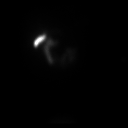
[frame 23/30]
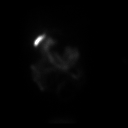
[frame 28/30]
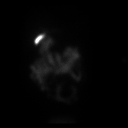

[hida · 3.20mm/px · 6 of 60 frames shown (2 of 2)]
[frame 6/60]
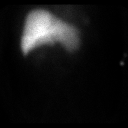
[frame 16/60]
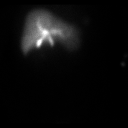
[frame 26/60]
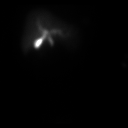
[frame 36/60]
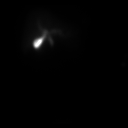
[frame 46/60]
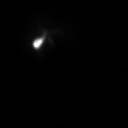
[frame 56/60]
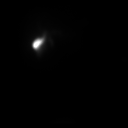

[12 of 12 positions shown; findings below may reference images not displayed]

FINDINGS: There is prompt visualization of hepatic activity. There
is prompt visualization of the common bile duct. Subsequently
intestinal activity was identified.

The gallbladder began to visualize at 13 minutes.

During CCK infusion the gallbladder contracted 79.8%. 30% or
greater is normal range.

During CCK infusion the patient reported  experiencing sensation of
increased abdominal pressure.
IMPRESSION: There is demonstration of patency of the common bile duct and the
cystic duct. There is no evidence of cholecystitis.

During CCK infusion the gallbladder contracted 79.8%. 30% or
greater is normal range.

During CCK infusion the patient reported  experiencing sensation of
increased abdominal pressure.

## 2012-07-08 ENCOUNTER — Other Ambulatory Visit: Payer: Self-pay

## 2012-07-08 DIAGNOSIS — Z1231 Encounter for screening mammogram for malignant neoplasm of breast: Secondary | ICD-10-CM

## 2012-08-09 ENCOUNTER — Ambulatory Visit
Admission: RE | Admit: 2012-08-09 | Discharge: 2012-08-09 | Disposition: A | Discharge status: Home / Self Care | Payer: 59 | Source: Ambulatory Visit

## 2012-08-09 DIAGNOSIS — Z1231 Encounter for screening mammogram for malignant neoplasm of breast: Secondary | ICD-10-CM

## 2012-12-13 ENCOUNTER — Other Ambulatory Visit (HOSPITAL_COMMUNITY): Payer: Self-pay | Admitting: Internal Medicine

## 2012-12-13 DIAGNOSIS — R51 Headache: Secondary | ICD-10-CM

## 2012-12-13 DIAGNOSIS — R42 Dizziness and giddiness: Secondary | ICD-10-CM

## 2012-12-16 ENCOUNTER — Ambulatory Visit (HOSPITAL_COMMUNITY)
Admission: RE | Admit: 2012-12-16 | Discharge: 2012-12-16 | Disposition: A | Discharge status: Home / Self Care | Payer: 59 | Source: Ambulatory Visit | Attending: Internal Medicine | Admitting: Internal Medicine

## 2012-12-16 DIAGNOSIS — R42 Dizziness and giddiness: Secondary | ICD-10-CM

## 2012-12-16 DIAGNOSIS — G43909 Migraine, unspecified, not intractable, without status migrainosus: Secondary | ICD-10-CM | POA: Insufficient documentation

## 2012-12-16 DIAGNOSIS — R569 Unspecified convulsions: Secondary | ICD-10-CM | POA: Insufficient documentation

## 2012-12-16 DIAGNOSIS — R51 Headache: Secondary | ICD-10-CM

## 2013-02-19 ENCOUNTER — Ambulatory Visit (HOSPITAL_COMMUNITY)
Admission: RE | Admit: 2013-02-19 | Discharge: 2013-02-19 | Disposition: A | Discharge status: Home / Self Care | Payer: 59 | Source: Ambulatory Visit | Attending: Internal Medicine | Admitting: Internal Medicine

## 2013-02-19 ENCOUNTER — Other Ambulatory Visit (HOSPITAL_COMMUNITY): Payer: Self-pay | Admitting: Internal Medicine

## 2013-02-19 DIAGNOSIS — R05 Cough: Secondary | ICD-10-CM

## 2013-02-19 DIAGNOSIS — R0989 Other specified symptoms and signs involving the circulatory and respiratory systems: Secondary | ICD-10-CM | POA: Insufficient documentation

## 2013-02-19 DIAGNOSIS — R059 Cough, unspecified: Secondary | ICD-10-CM | POA: Insufficient documentation

## 2013-09-30 ENCOUNTER — Other Ambulatory Visit: Payer: Self-pay

## 2013-09-30 DIAGNOSIS — Z1231 Encounter for screening mammogram for malignant neoplasm of breast: Secondary | ICD-10-CM

## 2013-10-16 ENCOUNTER — Ambulatory Visit
Admission: RE | Admit: 2013-10-16 | Discharge: 2013-10-16 | Disposition: A | Discharge status: Home / Self Care | Payer: 59 | Source: Ambulatory Visit

## 2013-10-16 DIAGNOSIS — Z1231 Encounter for screening mammogram for malignant neoplasm of breast: Secondary | ICD-10-CM

## 2014-11-13 ENCOUNTER — Other Ambulatory Visit: Payer: Self-pay

## 2014-11-13 DIAGNOSIS — Z1231 Encounter for screening mammogram for malignant neoplasm of breast: Secondary | ICD-10-CM

## 2014-11-18 ENCOUNTER — Ambulatory Visit
Admission: RE | Admit: 2014-11-18 | Discharge: 2014-11-18 | Disposition: A | Discharge status: Home / Self Care | Payer: 59 | Source: Ambulatory Visit

## 2014-11-18 DIAGNOSIS — Z1231 Encounter for screening mammogram for malignant neoplasm of breast: Secondary | ICD-10-CM

## 2014-12-07 ENCOUNTER — Ambulatory Visit (HOSPITAL_COMMUNITY)
Admission: RE | Admit: 2014-12-07 | Discharge: 2014-12-07 | Disposition: A | Discharge status: Home / Self Care | Payer: 59 | Source: Ambulatory Visit | Attending: Family Medicine | Admitting: Family Medicine

## 2014-12-07 ENCOUNTER — Other Ambulatory Visit (HOSPITAL_COMMUNITY): Payer: Self-pay | Admitting: Family Medicine

## 2014-12-07 DIAGNOSIS — R0781 Pleurodynia: Secondary | ICD-10-CM | POA: Diagnosis present

## 2015-03-29 ENCOUNTER — Encounter (INDEPENDENT_AMBULATORY_CARE_PROVIDER_SITE_OTHER): Payer: Self-pay | Admitting: *Deleted

## 2015-12-02 NOTE — Progress Notes (Signed)
Cardiology Office Note   Date:  12/03/2015   ID:  MARCELLENE RAGGS, DOB [birth year], MRN [id number]  PCP:  Glo Herring., MD  Cardiologist:   Jenkins Rouge, MD   No chief complaint on file.     History of Present Illness: Janice Stephens is a 65 y.o. female who presents for evaluation of chest pain.  [facility]ed [facility] and unfortunately there is no narrative of her current Problems History of depression and elevated lipids as well as GERD. She has been Rx for H pyloir in past  Quit smoking in 2011   She does take a statin and ASA  Appears to have been well physical exam 11/12/15   Labs:  Cr  .76 Hct 44.7 PLT 216 TC 203 LDL 125 Tri 196 TSH 2.0   She had one episode of right subcostal pain 6 months ago sitting on couch lasted 10 minutes Reminded her of GB pain but this was removed years ago Had 2nd episode same spot lasted 20 minutes after bending over at home felt like cramp  Some stress Mom is in NH with dementia and Husband has cancer seeing Sheryl prognosis poor  Past Medical History:  Diagnosis Date  . GERD (gastroesophageal reflux disease)   . Hyperlipidemia   . Insomnia   . Major depressive disorder     History reviewed. No pertinent surgical history.   Current Outpatient Prescriptions  Medication Sig Dispense Refill  . aspirin EC 81 MG tablet Take 81 mg by mouth daily.    . Cholecalciferol (VITAMIN D-3) 1000 units CAPS Take 1,000 Units by mouth daily.    Marland Kitchen co-enzyme Q-10 30 MG capsule Take 100 mg by mouth daily.    . diphenhydrAMINE (BENADRYL) 50 MG tablet Take 50 mg by mouth at bedtime as needed for itching.    . mirtazapine (REMERON SOL-TAB) 30 MG disintegrating tablet TAKE 1 TABLET BY MOUTH EVERYDAY AT BEDTIME  11  . Omega-3 Fatty Acids (FISH OIL) 1000 MG CPDR Take 1,000 mcg by mouth daily.    Marland Kitchen omeprazole (PRILOSEC) 20 MG capsule Take 20 mg by mouth daily.    . vitamin C (ASCORBIC ACID) 500 MG tablet Take 500 mg  by mouth daily.     No current facility-administered medications for this visit.     Allergies:   Doxycycline; Sulfonamide derivatives; and Zolpidem tartrate    Social History:  The patient  reports that she has quit smoking. Her smoking use included Cigarettes. She has never used smokeless tobacco. She reports that she does not drink alcohol or use drugs.   Family History:  The patient's family history includes AAA (abdominal aortic aneurysm) in her mother; Heart attack in her father; Heart disease in her father and mother.    ROS:  Please see the history of present illness.   Otherwise, review of systems are positive for none.   All other systems are reviewed and negative.    PHYSICAL EXAM: VS:  BP 122/80   Pulse 97   Ht 5\' 2"  (1.575 m)   Wt 70.3 kg (155 lb)   SpO2 93%   BMI 28.35 kg/m  , BMI Body mass index is 28.35 kg/m. Affect appropriate Healthy:  appears stated age 81: normal Neck supple with no adenopathy JVP normal no bruits no thyromegaly Lungs clear with no wheezing and good diaphragmatic motion Heart:  S1/S2 no murmur, no rub, gallop or click PMI normal Abdomen: benighn, BS positve, no  tenderness, no AAA no bruit.  No HSM or HJR Distal pulses intact with no bruits No edema Neuro non-focal Skin warm and dry No muscular weakness    EKG:  NSR rate 84 normal 12/03/15   Recent Labs: No results found for requested labs within last 8760 hours.    Lipid Panel No results found for: CHOL, TRIG, HDL, CHOLHDL, VLDL, LDLCALC, LDLDIRECT    Wt Readings from Last 3 Encounters:  12/03/15 70.3 kg (155 lb)  11/10/15 70.3 kg (155 lb)  01/29/09 70.8 kg (156 lb)      Other studies Reviewed: Additional studies/ records that were reviewed today include: Notes Dr Stormy Fabian and ECG .    ASSESSMENT AND PLAN:  1.  Chest Pain: atypical recurrent normal ECG f/u ETT  2. GERD: continue prilosec and low carb diet  3. Anxiety/Depression: related to family issues  consider SSRI f/u primary    Current medicines are reviewed at length with the patient today.  The patient does not have concerns regarding medicines.  The following changes have been made:  no change  Labs/ tests ordered today include: ETT   Orders Placed This Encounter  Procedures  . Exercise Tolerance Test  . EKG 12-Lead     Disposition:   FU with cards PRN      Signed, Jenkins Rouge, MD  12/03/2015 1:49 PM    [facility] [address], [place], [place]  [address] Phone: [phone]; Fax: [phone]

## 2015-12-03 ENCOUNTER — Encounter: Payer: Self-pay | Admitting: Cardiovascular Disease

## 2015-12-03 ENCOUNTER — Ambulatory Visit (INDEPENDENT_AMBULATORY_CARE_PROVIDER_SITE_OTHER): Payer: Medicare Other | Admitting: Cardiovascular Disease

## 2015-12-03 VITALS — BP 122/80 | HR 97 | Ht 62.0 in | Wt 155.0 lb

## 2015-12-03 DIAGNOSIS — R072 Precordial pain: Secondary | ICD-10-CM | POA: Diagnosis not present

## 2015-12-03 NOTE — Patient Instructions (Signed)
Your physician recommends that you schedule a follow-up appointment in: as to be determined    Your physician recommends that you continue on your current medications as directed. Please refer to the Current Medication list given to you today.   Your physician has requested that you have an exercise tolerance test. For further information please visit [email or web address]. Please also follow instruction sheet, as given.     Thank you for choosing [facility] !

## 2015-12-07 ENCOUNTER — Ambulatory Visit (HOSPITAL_COMMUNITY)
Admission: RE | Admit: 2015-12-07 | Discharge: 2015-12-07 | Disposition: A | Discharge status: Home / Self Care | Payer: Medicare Other | Source: Ambulatory Visit | Attending: Cardiovascular Disease | Admitting: Cardiovascular Disease

## 2015-12-07 DIAGNOSIS — R072 Precordial pain: Secondary | ICD-10-CM | POA: Insufficient documentation

## 2015-12-07 LAB — EXERCISE TOLERANCE TEST
CHL CUP MPHR: 156 {beats}/min
CHL RATE OF PERCEIVED EXERTION: 15
CSEPEDS: 34 s
CSEPHR: 93 %
Estimated workload: 7 METS
Exercise duration (min): 4 min
Peak HR: 146 {beats}/min
Rest HR: 88 {beats}/min

## 2015-12-15 ENCOUNTER — Other Ambulatory Visit: Payer: Self-pay | Admitting: Internal Medicine

## 2015-12-15 DIAGNOSIS — Z1231 Encounter for screening mammogram for malignant neoplasm of breast: Secondary | ICD-10-CM

## 2015-12-23 ENCOUNTER — Ambulatory Visit
Admission: RE | Admit: 2015-12-23 | Discharge: 2015-12-23 | Disposition: A | Discharge status: Home / Self Care | Payer: Medicare Other | Source: Ambulatory Visit | Attending: Internal Medicine | Admitting: Internal Medicine

## 2015-12-23 DIAGNOSIS — Z1231 Encounter for screening mammogram for malignant neoplasm of breast: Secondary | ICD-10-CM

## 2015-12-28 ENCOUNTER — Other Ambulatory Visit (HOSPITAL_COMMUNITY): Payer: Self-pay | Admitting: Internal Medicine

## 2015-12-28 DIAGNOSIS — R1011 Right upper quadrant pain: Secondary | ICD-10-CM

## 2016-01-04 ENCOUNTER — Ambulatory Visit (HOSPITAL_COMMUNITY)
Admission: RE | Admit: 2016-01-04 | Discharge: 2016-01-04 | Disposition: A | Discharge status: Home / Self Care | Payer: Medicare Other | Source: Ambulatory Visit | Attending: Internal Medicine | Admitting: Internal Medicine

## 2016-01-04 DIAGNOSIS — R1011 Right upper quadrant pain: Secondary | ICD-10-CM

## 2016-01-04 DIAGNOSIS — R109 Unspecified abdominal pain: Secondary | ICD-10-CM | POA: Insufficient documentation

## 2016-01-04 DIAGNOSIS — K5730 Diverticulosis of large intestine without perforation or abscess without bleeding: Secondary | ICD-10-CM | POA: Diagnosis not present

## 2016-01-04 DIAGNOSIS — K7689 Other specified diseases of liver: Secondary | ICD-10-CM | POA: Diagnosis not present

## 2016-01-04 DIAGNOSIS — Z1389 Encounter for screening for other disorder: Secondary | ICD-10-CM | POA: Diagnosis not present

## 2016-01-04 DIAGNOSIS — I700 Atherosclerosis of aorta: Secondary | ICD-10-CM | POA: Diagnosis not present

## 2016-01-04 LAB — POCT I-STAT CREATININE: CREATININE: 0.6 mg/dL (ref 0.44–1.00)

## 2016-01-04 MED ORDER — IOPAMIDOL (ISOVUE-300) INJECTION 61%
100.0000 mL | Freq: Once | INTRAVENOUS | Status: AC | PRN
  Administered 2016-01-04: 100 mL via INTRAVENOUS

## 2016-12-11 ENCOUNTER — Other Ambulatory Visit: Payer: Self-pay | Admitting: Internal Medicine

## 2016-12-11 DIAGNOSIS — Z1231 Encounter for screening mammogram for malignant neoplasm of breast: Secondary | ICD-10-CM

## 2017-01-02 ENCOUNTER — Ambulatory Visit
Admission: RE | Admit: 2017-01-02 | Discharge: 2017-01-02 | Disposition: A | Discharge status: Home / Self Care | Payer: Medicare Other | Source: Ambulatory Visit | Attending: Internal Medicine | Admitting: Internal Medicine

## 2017-01-02 DIAGNOSIS — Z1231 Encounter for screening mammogram for malignant neoplasm of breast: Secondary | ICD-10-CM

## 2017-09-21 ENCOUNTER — Other Ambulatory Visit: Payer: Self-pay | Admitting: Physician Assistant

## 2017-09-21 ENCOUNTER — Other Ambulatory Visit (HOSPITAL_COMMUNITY): Payer: Self-pay | Admitting: Physician Assistant

## 2017-09-21 DIAGNOSIS — K921 Melena: Secondary | ICD-10-CM

## 2017-09-21 DIAGNOSIS — R1011 Right upper quadrant pain: Secondary | ICD-10-CM

## 2017-10-05 ENCOUNTER — Ambulatory Visit (HOSPITAL_COMMUNITY)
Admission: RE | Admit: 2017-10-05 | Discharge: 2017-10-05 | Disposition: A | Discharge status: Home / Self Care | Payer: Medicare Other | Source: Ambulatory Visit | Attending: Physician Assistant | Admitting: Physician Assistant

## 2017-10-05 DIAGNOSIS — K921 Melena: Secondary | ICD-10-CM

## 2017-10-05 DIAGNOSIS — R1011 Right upper quadrant pain: Secondary | ICD-10-CM

## 2017-10-05 LAB — POCT I-STAT CREATININE: CREATININE: 0.7 mg/dL (ref 0.44–1.00)

## 2017-10-05 MED ORDER — IOHEXOL 300 MG/ML  SOLN
100.0000 mL | Freq: Once | INTRAMUSCULAR | Status: AC | PRN
  Administered 2017-10-05: 100 mL via INTRAVENOUS

## 2017-11-27 ENCOUNTER — Other Ambulatory Visit: Payer: Self-pay | Admitting: Internal Medicine

## 2017-11-27 DIAGNOSIS — Z1231 Encounter for screening mammogram for malignant neoplasm of breast: Secondary | ICD-10-CM

## 2018-01-23 ENCOUNTER — Ambulatory Visit
Admission: RE | Admit: 2018-01-23 | Discharge: 2018-01-23 | Disposition: A | Discharge status: Home / Self Care | Payer: Medicare Other | Source: Ambulatory Visit | Attending: Internal Medicine | Admitting: Internal Medicine

## 2018-01-23 DIAGNOSIS — Z1231 Encounter for screening mammogram for malignant neoplasm of breast: Secondary | ICD-10-CM

## 2018-09-12 ENCOUNTER — Other Ambulatory Visit: Payer: Self-pay

## 2018-09-12 ENCOUNTER — Ambulatory Visit (INDEPENDENT_AMBULATORY_CARE_PROVIDER_SITE_OTHER): Payer: Medicare Other | Admitting: Internal Medicine

## 2018-09-12 ENCOUNTER — Encounter (INDEPENDENT_AMBULATORY_CARE_PROVIDER_SITE_OTHER): Payer: Self-pay | Admitting: Internal Medicine

## 2018-09-12 VITALS — BP 146/94 | HR 114 | Temp 98.2°F | Resp 18 | Ht 63.0 in | Wt 153.3 lb

## 2018-09-12 DIAGNOSIS — K2100 Gastro-esophageal reflux disease with esophagitis, without bleeding: Secondary | ICD-10-CM

## 2018-09-12 DIAGNOSIS — K625 Hemorrhage of anus and rectum: Secondary | ICD-10-CM | POA: Diagnosis not present

## 2018-09-12 DIAGNOSIS — F411 Generalized anxiety disorder: Secondary | ICD-10-CM

## 2018-09-12 NOTE — Patient Instructions (Addendum)
1. Continue Omeprazole 20mg  once daily to be taken 30 minute before breakfast.  2. Add Pepcid 20mg  one tab at bed time.  3. Call office if heartburn symptoms worsen or if you have another episode of right chest pain  4. Continue Miralax and Metamucil daily.   5. May use Desitin cream twice daily for hemorrhoid/anal irritation as needed  6. Schedule an EGD and colonoscopy   7. Further follow up to be determined after EGD and colonoscopy completed

## 2018-09-12 NOTE — Progress Notes (Cosign Needed Addendum)
Subjective:    Patient ID: Janice Stephens, female    DOB: [birth year], 68 y.o.   MRN: [id number]  HPI is a 68 y.o. female with a history of GERD, H. Pylori, colon polyps, hyperlipidemia and anxiety/depression. She presents today with complaints of having daily heartburn for the past few years. She is taking Omeprazole 20mg  once daily in the am. She takes 1 or 2 TUMS most days. No dysphagia or stomach pain. She reports having a history of H.Pylori, details are unclear. She has arthritis, prescribed Meloxicam 7.5mg  QD, plans to start taking tomorrow. She takes ASA 81mg  daily. No other NSAID use. She reports having infrequent episodes of right chest pain under her right breast which started 2 or 3 years ago. She saw a cardiologist and underwent a negative stress test 2 or 3 years ago which she reported was normal. She reported having 2 episodes of RUQ chest pain over the past 6 months, sometimes the pain radiates into her neck, jaw and ear. The past 2 episodes, the most recent was one month ago, awakened her from sleep at 5:30am. No associated nausea or vomiting. She underwent a cholecystectomy in the past due to gallbladder dysfunction, no gallstones. Her most recent mammogram was 10/2017 which was normal. Intermittent constipation.She is passing a normal solid stool once daily as long as she takes Metamucil and Miralax. She passed a regular BM without straining 2 weeks ago and passed a moderate amount of bright red blood in the toilet associated with anal burning. No further rectal bleeding since then. She sometimes has difficulty passing the stool out, feels like it gets stuck in the rectum and she changes her position on the toilet to facilitate passing a BM. Stress level is elevated since her husband died from prostate/lung/stomach cancer 1 year ago.  ADDENDUM: Review of records confirmed patient was evaluated by cardiologist, Dr. Merlyn Albert on 12/03/2015. She underwent an exercise stress test  12/07/2015: Blood pressure demonstrated a normal response to exercise.  There was no ST segment deviation noted during stress.  Low risk Duke treadmill score portends a favorable prognosis with  respect to cardiac events.   Abdominal/Pelvic CT 09/21/2017: Hepatobiliary: Liver is normal in size and contour. Re demonstrated hepatic cysts. Patient status post cholecystectomy. No intrahepatic or extrahepatic biliary ductal dilatation. No acute process within the abd/pelvic.  Abdominal Sono 03/28/2010: Hepatobiliary: Liver is normal in size and contour. Re demonstrated hepatic cysts. Patient status post cholecystectomy. No intrahepatic or extrahepatic biliary ductal dilatation.  EGD 03/24/2010: erosive gastritis, no evidence of H. Pylori Colonoscopy 03/24/2010: Blunt end of cecum, few diverticula at the sigmoid colon a 19mm tubulovillous polyp at the transverse colon and a 7-30mm tubulovillous  polyp at the proximal sigmoid colon.  No known family history of colon cancer  Prior smoker, quit in 2011  Past Medical History:  Diagnosis Date   GERD (gastroesophageal reflux disease)    Hyperlipidemia    Insomnia    Major depressive disorder    Past Surgical History:  Procedure Laterality Date   ABDOMINAL HYSTERECTOMY     APPENDECTOMY     CHOLECYSTECTOMY     COLONOSCOPY  2012   ORTHOPEDIC SURGERY     right and left knee   TENDON REPAIR Right    Elbow   TONSILLECTOMY     UPPER GASTROINTESTINAL ENDOSCOPY      Current Outpatient Medications on File Prior to Visit  Medication Sig Dispense Refill   ALPRAZolam (XANAX) 0.5 MG tablet Take 0.5  mg by mouth as needed for anxiety.     aspirin EC 81 MG tablet Take 81 mg by mouth daily.     b complex vitamins tablet Take 1 tablet by mouth daily.     Cholecalciferol (VITAMIN D-3) 1000 units CAPS Take 1,000 Units by mouth daily.     co-enzyme Q-10 30 MG capsule Take 200 mg by mouth daily.      diphenhydrAMINE (BENADRYL) 50 MG tablet  Take 50 mg by mouth at bedtime as needed for itching.     [START ON 09/13/2018] meloxicam (MOBIC) 7.5 MG tablet Take 7.5 mg by mouth daily. Patient plans to start 09/13/2018.     mirtazapine (REMERON SOL-TAB) 30 MG disintegrating tablet TAKE 1 TABLET BY MOUTH EVERYDAY AT BEDTIME  11   Multiple Vitamins-Minerals (MULTI COMPLETE PO) Take by mouth daily.     Omega-3 Fatty Acids (FISH OIL) 1000 MG CPDR Take 1,000 mcg by mouth daily.     omeprazole (PRILOSEC) 20 MG capsule Take 20 mg by mouth daily.     pravastatin (PRAVACHOL) 20 MG tablet Take 20 mg by mouth daily.     pyridOXINE (VITAMIN B-6) 100 MG tablet Take 100 mg by mouth daily.     TURMERIC CURCUMIN PO Take by mouth daily.     No current facility-administered medications on file prior to visit.       Review of SystemsSee HPI. All other systems reviewed and are negative.     Objective:   Physical Exam  General: Well developed female in NAD, slightly anxious. HEENT:Hearing intact. No congestion. Eyes: Sclera nonicteric, conjunctiva pink. Neck: Supple. Heart: RRR, no murmurs, tachycardic HR 110. Lungs: Clear throughout. Abdomen:Soft, nontender, no masses or organomegaly. Rectal: Patient declined exam. Extremities:  No edema.  Assessment & Plan:   1. GERD -Omeprazole 20mg  po in the am and Pepcid 20mg  at bed time. -EGD -May need to stop Meloxicam if GERD sx worsen, advised no NSAID use  2. Infrequent right chest pain, negative  ETT 102017 -patient to call if right CP recurs, consider abd sono to evaluate the CBD, CBC, CMP and lipase  -proceed with EGD for further esophageal evaluation  3. History of tubulovillous colon polyps -proceed with colonoscopy  4. Rectal bleeding  -proceed with colonoscopy -Fiber and Miralax -Desitin cream as needed for anal/hemorrhoid discomfort  5. Hx liver cysts

## 2018-09-16 ENCOUNTER — Encounter (INDEPENDENT_AMBULATORY_CARE_PROVIDER_SITE_OTHER): Payer: Self-pay | Admitting: *Deleted

## 2018-09-16 ENCOUNTER — Telehealth (INDEPENDENT_AMBULATORY_CARE_PROVIDER_SITE_OTHER): Payer: Self-pay | Admitting: *Deleted

## 2018-09-16 DIAGNOSIS — K625 Hemorrhage of anus and rectum: Secondary | ICD-10-CM | POA: Insufficient documentation

## 2018-09-16 DIAGNOSIS — K2100 Gastro-esophageal reflux disease with esophagitis, without bleeding: Secondary | ICD-10-CM | POA: Insufficient documentation

## 2018-09-16 DIAGNOSIS — F411 Generalized anxiety disorder: Secondary | ICD-10-CM | POA: Insufficient documentation

## 2018-09-16 MED ORDER — PEG 3350-KCL-NA BICARB-NACL 420 G PO SOLR: 4000.0000 mL | Freq: Once | ORAL | 0 refills | Status: AC

## 2018-09-16 NOTE — Telephone Encounter (Signed)
Patient needs trilyte 

## 2018-10-02 ENCOUNTER — Other Ambulatory Visit (HOSPITAL_COMMUNITY)
Admission: RE | Admit: 2018-10-02 | Discharge: 2018-10-02 | Disposition: A | Discharge status: Home / Self Care | Payer: Medicare Other | Source: Ambulatory Visit | Attending: Internal Medicine | Admitting: Internal Medicine

## 2018-10-02 ENCOUNTER — Other Ambulatory Visit: Payer: Self-pay

## 2018-10-02 ENCOUNTER — Encounter (HOSPITAL_COMMUNITY)
Admission: RE | Admit: 2018-10-02 | Discharge: 2018-10-02 | Disposition: A | Discharge status: Home / Self Care | Payer: Medicare Other | Source: Ambulatory Visit | Attending: Internal Medicine | Admitting: Internal Medicine

## 2018-10-02 DIAGNOSIS — Z01812 Encounter for preprocedural laboratory examination: Secondary | ICD-10-CM | POA: Insufficient documentation

## 2018-10-02 DIAGNOSIS — K635 Polyp of colon: Secondary | ICD-10-CM | POA: Diagnosis not present

## 2018-10-02 DIAGNOSIS — K449 Diaphragmatic hernia without obstruction or gangrene: Secondary | ICD-10-CM | POA: Insufficient documentation

## 2018-10-02 DIAGNOSIS — K219 Gastro-esophageal reflux disease without esophagitis: Secondary | ICD-10-CM | POA: Diagnosis not present

## 2018-10-02 DIAGNOSIS — K649 Unspecified hemorrhoids: Secondary | ICD-10-CM | POA: Insufficient documentation

## 2018-10-02 DIAGNOSIS — K625 Hemorrhage of anus and rectum: Secondary | ICD-10-CM | POA: Insufficient documentation

## 2018-10-02 DIAGNOSIS — Z20828 Contact with and (suspected) exposure to other viral communicable diseases: Secondary | ICD-10-CM | POA: Insufficient documentation

## 2018-10-02 DIAGNOSIS — K5790 Diverticulosis of intestine, part unspecified, without perforation or abscess without bleeding: Secondary | ICD-10-CM | POA: Insufficient documentation

## 2018-10-02 LAB — SARS CORONAVIRUS 2 (TAT 6-24 HRS): SARS Coronavirus 2: NEGATIVE

## 2018-10-04 ENCOUNTER — Encounter (HOSPITAL_COMMUNITY)
Admission: RE | Disposition: A | Discharge status: Home / Self Care | Payer: Self-pay | Source: Home / Self Care | Attending: Internal Medicine

## 2018-10-04 ENCOUNTER — Ambulatory Visit (HOSPITAL_COMMUNITY): Payer: Medicare Other | Admitting: Anesthesiology

## 2018-10-04 ENCOUNTER — Ambulatory Visit (HOSPITAL_COMMUNITY)
Admission: RE | Admit: 2018-10-04 | Discharge: 2018-10-04 | Disposition: A | Discharge status: Home / Self Care | Payer: Medicare Other | Attending: Internal Medicine | Admitting: Internal Medicine

## 2018-10-04 ENCOUNTER — Encounter (HOSPITAL_COMMUNITY): Payer: Self-pay | Admitting: Anesthesiology

## 2018-10-04 DIAGNOSIS — K449 Diaphragmatic hernia without obstruction or gangrene: Secondary | ICD-10-CM | POA: Insufficient documentation

## 2018-10-04 DIAGNOSIS — Z9049 Acquired absence of other specified parts of digestive tract: Secondary | ICD-10-CM | POA: Diagnosis not present

## 2018-10-04 DIAGNOSIS — Z7982 Long term (current) use of aspirin: Secondary | ICD-10-CM | POA: Insufficient documentation

## 2018-10-04 DIAGNOSIS — K2100 Gastro-esophageal reflux disease with esophagitis, without bleeding: Secondary | ICD-10-CM | POA: Insufficient documentation

## 2018-10-04 DIAGNOSIS — K644 Residual hemorrhoidal skin tags: Secondary | ICD-10-CM | POA: Insufficient documentation

## 2018-10-04 DIAGNOSIS — Z791 Long term (current) use of non-steroidal anti-inflammatories (NSAID): Secondary | ICD-10-CM | POA: Insufficient documentation

## 2018-10-04 DIAGNOSIS — Z87891 Personal history of nicotine dependence: Secondary | ICD-10-CM | POA: Insufficient documentation

## 2018-10-04 DIAGNOSIS — R1013 Epigastric pain: Secondary | ICD-10-CM

## 2018-10-04 DIAGNOSIS — Z86010 Personal history of colonic polyps: Secondary | ICD-10-CM | POA: Insufficient documentation

## 2018-10-04 DIAGNOSIS — Z90710 Acquired absence of both cervix and uterus: Secondary | ICD-10-CM | POA: Insufficient documentation

## 2018-10-04 DIAGNOSIS — Z882 Allergy status to sulfonamides status: Secondary | ICD-10-CM | POA: Diagnosis not present

## 2018-10-04 DIAGNOSIS — F411 Generalized anxiety disorder: Secondary | ICD-10-CM | POA: Insufficient documentation

## 2018-10-04 DIAGNOSIS — K625 Hemorrhage of anus and rectum: Secondary | ICD-10-CM | POA: Insufficient documentation

## 2018-10-04 DIAGNOSIS — F329 Major depressive disorder, single episode, unspecified: Secondary | ICD-10-CM | POA: Diagnosis not present

## 2018-10-04 DIAGNOSIS — Z888 Allergy status to other drugs, medicaments and biological substances status: Secondary | ICD-10-CM | POA: Diagnosis not present

## 2018-10-04 DIAGNOSIS — G4700 Insomnia, unspecified: Secondary | ICD-10-CM | POA: Diagnosis not present

## 2018-10-04 DIAGNOSIS — K219 Gastro-esophageal reflux disease without esophagitis: Secondary | ICD-10-CM | POA: Insufficient documentation

## 2018-10-04 DIAGNOSIS — K5730 Diverticulosis of large intestine without perforation or abscess without bleeding: Secondary | ICD-10-CM | POA: Insufficient documentation

## 2018-10-04 DIAGNOSIS — E785 Hyperlipidemia, unspecified: Secondary | ICD-10-CM | POA: Insufficient documentation

## 2018-10-04 DIAGNOSIS — Z881 Allergy status to other antibiotic agents status: Secondary | ICD-10-CM | POA: Diagnosis not present

## 2018-10-04 DIAGNOSIS — Z79899 Other long term (current) drug therapy: Secondary | ICD-10-CM | POA: Diagnosis not present

## 2018-10-04 DIAGNOSIS — R569 Unspecified convulsions: Secondary | ICD-10-CM | POA: Insufficient documentation

## 2018-10-04 DIAGNOSIS — K319 Disease of stomach and duodenum, unspecified: Secondary | ICD-10-CM | POA: Insufficient documentation

## 2018-10-04 DIAGNOSIS — Z800 Family history of malignant neoplasm of digestive organs: Secondary | ICD-10-CM | POA: Diagnosis not present

## 2018-10-04 DIAGNOSIS — Z8249 Family history of ischemic heart disease and other diseases of the circulatory system: Secondary | ICD-10-CM | POA: Insufficient documentation

## 2018-10-04 DIAGNOSIS — K648 Other hemorrhoids: Secondary | ICD-10-CM | POA: Insufficient documentation

## 2018-10-04 DIAGNOSIS — K228 Other specified diseases of esophagus: Secondary | ICD-10-CM

## 2018-10-04 HISTORY — PX: ESOPHAGOGASTRODUODENOSCOPY (EGD) WITH PROPOFOL: SHX5813

## 2018-10-04 HISTORY — PX: COLONOSCOPY WITH PROPOFOL: SHX5780

## 2018-10-04 SURGERY — ESOPHAGOGASTRODUODENOSCOPY (EGD) WITH PROPOFOL
Anesthesia: General

## 2018-10-04 MED ORDER — PROMETHAZINE HCL 25 MG/ML IJ SOLN: 6.2500 mg | INTRAMUSCULAR | Status: DC | PRN

## 2018-10-04 MED ORDER — PROPOFOL 10 MG/ML IV BOLUS
INTRAVENOUS | Status: AC
  Filled 2018-10-04: qty 40

## 2018-10-04 MED ORDER — PROPOFOL 500 MG/50ML IV EMUL
INTRAVENOUS | Status: DC | PRN
  Administered 2018-10-04: 150 ug/kg/min via INTRAVENOUS

## 2018-10-04 MED ORDER — KETAMINE HCL 10 MG/ML IJ SOLN
INTRAMUSCULAR | Status: DC | PRN
  Administered 2018-10-04: 10 mg via INTRAVENOUS

## 2018-10-04 MED ORDER — GLYCOPYRROLATE 0.2 MG/ML IJ SOLN
INTRAMUSCULAR | Status: DC | PRN
  Administered 2018-10-04: 0.2 mg via INTRAVENOUS

## 2018-10-04 MED ORDER — PROPOFOL 10 MG/ML IV BOLUS
INTRAVENOUS | Status: DC | PRN
  Administered 2018-10-04: 20 mg via INTRAVENOUS

## 2018-10-04 MED ORDER — KETAMINE HCL 50 MG/5ML IJ SOSY
PREFILLED_SYRINGE | INTRAMUSCULAR | Status: AC
  Filled 2018-10-04: qty 5

## 2018-10-04 MED ORDER — FAMOTIDINE 20 MG PO TABS: 20.0000 mg | ORAL_TABLET | Freq: Every evening | ORAL | Status: AC | PRN

## 2018-10-04 MED ORDER — PROPOFOL 10 MG/ML IV BOLUS
INTRAVENOUS | Status: AC
  Filled 2018-10-04: qty 60

## 2018-10-04 MED ORDER — MIDAZOLAM HCL 2 MG/2ML IJ SOLN: 0.5000 mg | Freq: Once | INTRAMUSCULAR | Status: DC | PRN

## 2018-10-04 MED ORDER — HYDROCODONE-ACETAMINOPHEN 7.5-325 MG PO TABS: 1.0000 | ORAL_TABLET | Freq: Once | ORAL | Status: DC | PRN

## 2018-10-04 MED ORDER — LACTATED RINGERS IV SOLN
INTRAVENOUS | Status: DC | PRN
  Administered 2018-10-04: 09:00:00 via INTRAVENOUS

## 2018-10-04 MED ORDER — HYDROMORPHONE HCL 1 MG/ML IJ SOLN: 0.2500 mg | INTRAMUSCULAR | Status: DC | PRN

## 2018-10-04 MED ORDER — LIDOCAINE HCL (CARDIAC) PF 100 MG/5ML IV SOSY
PREFILLED_SYRINGE | INTRAVENOUS | Status: DC | PRN
  Administered 2018-10-04: 40 mg via INTRAVENOUS

## 2018-10-04 MED ORDER — BENEFIBER DRINK MIX PO PACK: 4.0000 g | PACK | Freq: Every day | ORAL | Status: DC

## 2018-10-04 NOTE — Discharge Instructions (Signed)
Resume usual medications as before including aspirin at usual dose. High-fiber diet Benefiber or equivalent 4 g by mouth daily at bedtime. No driving for 24 hours. Office visit in 3 months.   Monitored Anesthesia Care Anesthesia is a term that refers to techniques, procedures, and medicines that help a person stay safe and comfortable during a medical procedure. Monitored anesthesia care, or sedation, is one type of anesthesia. Your anesthesia specialist may recommend sedation if you will be having a procedure that does not require you to be unconscious, such as:  Cataract surgery.  A dental procedure.  A biopsy.  A colonoscopy. During the procedure, you may receive a medicine to help you relax (sedative). There are three levels of sedation:  Mild sedation. At this level, you may feel awake and relaxed. You will be able to follow directions.  Moderate sedation. At this level, you will be sleepy. You may not remember the procedure.  Deep sedation. At this level, you will be asleep. You will not remember the procedure. The more medicine you are given, the deeper your level of sedation will be. Depending on how you respond to the procedure, the anesthesia specialist may change your level of sedation or the type of anesthesia to fit your needs. An anesthesia specialist will monitor you closely during the procedure. Let your health care provider know about:  Any allergies you have.  All medicines you are taking, including vitamins, herbs, eye drops, creams, and over-the-counter medicines.  Any use of steroids (by mouth or as a cream).  Any problems you or family members have had with sedatives and anesthetic medicines.  Any blood disorders you have.  Any surgeries you have had.  Any medical conditions you have, such as sleep apnea.  Whether you are pregnant or may be pregnant.  Any use of cigarettes, alcohol, or street drugs. What are the risks? Generally, this is a safe  procedure. However, problems may occur, including:  Getting too much medicine (oversedation).  Nausea.  Allergic reaction to medicines.  Trouble breathing. If this happens, a breathing tube may be used to help with breathing. It will be removed when you are awake and breathing on your own.  Heart trouble.  Lung trouble. Before the procedure Staying hydrated Follow instructions from your health care provider about hydration, which may include:  Up to 2 hours before the procedure - you may continue to drink clear liquids, such as water, clear fruit juice, black coffee, and plain tea. Eating and drinking restrictions Follow instructions from your health care provider about eating and drinking, which may include:  8 hours before the procedure - stop eating heavy meals or foods such as meat, fried foods, or fatty foods.  6 hours before the procedure - stop eating light meals or foods, such as toast or cereal.  6 hours before the procedure - stop drinking milk or drinks that contain milk.  2 hours before the procedure - stop drinking clear liquids. Medicines Ask your health care provider about:  Changing or stopping your regular medicines. This is especially important if you are taking diabetes medicines or blood thinners.  Taking medicines such as aspirin and ibuprofen. These medicines can thin your blood. Do not take these medicines before your procedure if your health care provider instructs you not to. Tests and exams  You will have a physical exam.  You may have blood tests done to show: ? How well your kidneys and liver are working. ? How well your blood  can clot. General instructions  Plan to have someone take you home from the hospital or clinic.  If you will be going home right after the procedure, plan to have someone with you for 24 hours.  What happens during the procedure?  Your blood pressure, heart rate, breathing, level of pain and overall condition will be  monitored.  An IV tube will be inserted into one of your veins.  Your anesthesia specialist will give you medicines as needed to keep you comfortable during the procedure. This may mean changing the level of sedation.  The procedure will be performed. After the procedure  Your blood pressure, heart rate, breathing rate, and blood oxygen level will be monitored until the medicines you were given have worn off.  Do not drive for 24 hours if you received a sedative.  You may: ? Feel sleepy, clumsy, or nauseous. ? Feel forgetful about what happened after the procedure. ? Have a sore throat if you had a breathing tube during the procedure. ? Vomit. This information is not intended to replace advice given to you by your health care provider. Make sure you discuss any questions you have with your health care provider. Document Released: 11/02/2004 Document Revised: 01/19/2017 Document Reviewed: 05/30/2015 Elsevier Patient Education  [place].    Colonoscopy, Adult, Care After This sheet gives you information about how to care for yourself after your procedure. Your doctor may also give you more specific instructions. If you have problems or questions, call your doctor. What can I expect after the procedure? After the procedure, it is common to have:  A small amount of blood in your poop for 24 hours.  Some gas.  Mild cramping or bloating in your belly. Follow these instructions at home: General instructions  For the first 24 hours after the procedure: ? Do not drive or use machinery. ? Do not sign important documents. ? Do not drink alcohol. ? Do your daily activities more slowly than normal. ? Eat foods that are soft and easy to digest.  Take over-the-counter or prescription medicines only as told by your doctor. To help cramping and bloating:   Try walking around.  Put heat on your belly (abdomen) as told by your doctor. Use a heat source that your doctor  recommends, such as a moist heat pack or a heating pad. ? Put a towel between your skin and the heat source. ? Leave the heat on for 20-30 minutes. ? Remove the heat if your skin turns bright red. This is especially important if you cannot feel pain, heat, or cold. You can get burned. Eating and drinking   Drink enough fluid to keep your pee (urine) clear or pale yellow.  Return to your normal diet as told by your doctor. Avoid heavy or fried foods that are hard to digest.  Avoid drinking alcohol for as long as told by your doctor. Contact a doctor if:  You have blood in your poop (stool) 2-3 days after the procedure. Get help right away if:  You have more than a small amount of blood in your poop.  You see large clumps of tissue (blood clots) in your poop.  Your belly is swollen.  You feel sick to your stomach (nauseous).  You throw up (vomit).  You have a fever.  You have belly pain that gets worse, and medicine does not help your pain. Summary  After the procedure, it is common to have a small amount of blood in  your poop. You may also have mild cramping and bloating in your belly.  For the first 24 hours after the procedure, do not drive or use machinery, do not sign important documents, and do not drink alcohol.  Get help right away if you have a lot of blood in your poop, feel sick to your stomach, have a fever, or have more belly pain. This information is not intended to replace advice given to you by your health care provider. Make sure you discuss any questions you have with your health care provider. Document Released: 03/11/2010 Document Revised: 12/07/2016 Document Reviewed: 11/01/2015 Elsevier Patient Education  2020 [employer].   Diverticulosis  Diverticulosis is a condition that develops when small pouches (diverticula) form in the wall of the large intestine (colon). The colon is where water is absorbed and stool is formed. The pouches form when the  inside layer of the colon pushes through weak spots in the outer layers of the colon. You may have a few pouches or many of them. What are the causes? The cause of this condition is not known. What increases the risk? The following factors may make you more likely to develop this condition:  Being older than age 16. Your risk for this condition increases with age. Diverticulosis is rare among people younger than age 27. By age 3, many people have it.  Eating a low-fiber diet.  Having frequent constipation.  Being overweight.  Not getting enough exercise.  Smoking.  Taking over-the-counter pain medicines, like aspirin and ibuprofen.  Having a family history of diverticulosis. What are the signs or symptoms? In most people, there are no symptoms of this condition. If you do have symptoms, they may include:  Bloating.  Cramps in the abdomen.  Constipation or diarrhea.  Pain in the lower left side of the abdomen. How is this diagnosed? This condition is most often diagnosed during an exam for other colon problems. Because diverticulosis usually has no symptoms, it often cannot be diagnosed independently. This condition may be diagnosed by:  Using a flexible scope to examine the colon (colonoscopy).  Taking an X-ray of the colon after dye has been put into the colon (barium enema).  Doing a CT scan. How is this treated? You may not need treatment for this condition if you have never developed an infection related to diverticulosis. If you have had an infection before, treatment may include:  Eating a high-fiber diet. This may include eating more fruits, vegetables, and grains.  Taking a fiber supplement.  Taking a live bacteria supplement (probiotic).  Taking medicine to relax your colon.  Taking antibiotic medicines. Follow these instructions at home:  Drink 6-8 glasses of water or more each day to prevent constipation.  Try not to strain when you have a bowel  movement.  If you have had an infection before: ? Eat more fiber as directed by your health care provider or your diet and nutrition specialist (dietitian). ? Take a fiber supplement or probiotic, if your health care provider approves.  Take over-the-counter and prescription medicines only as told by your health care provider.  If you were prescribed an antibiotic, take it as told by your health care provider. Do not stop taking the antibiotic even if you start to feel better.  Keep all follow-up visits as told by your health care provider. This is important. Contact a health care provider if:  You have pain in your abdomen.  You have bloating.  You have cramps.  You have not had a bowel movement in 3 days. Get help right away if:  Your pain gets worse.  Your bloating becomes very bad.  You have a fever or chills, and your symptoms suddenly get worse.  You vomit.  You have bowel movements that are bloody or black.  You have bleeding from your rectum. Summary  Diverticulosis is a condition that develops when small pouches (diverticula) form in the wall of the large intestine (colon).  You may have a few pouches or many of them.  This condition is most often diagnosed during an exam for other colon problems.  If you have had an infection related to diverticulosis, treatment may include increasing the fiber in your diet, taking supplements, or taking medicines. This information is not intended to replace advice given to you by your health care provider. Make sure you discuss any questions you have with your health care provider. Document Released: 11/04/2003 Document Revised: 01/19/2017 Document Reviewed: 12/27/2015 Elsevier Patient Education  2020 [employer].    Hemorrhoids Hemorrhoids are swollen veins that may develop:  In the butt (rectum). These are called internal hemorrhoids.  Around the opening of the butt (anus). These are called external  hemorrhoids. Hemorrhoids can cause pain, itching, or bleeding. Most of the time, they do not cause serious problems. They usually get better with diet changes, lifestyle changes, and other home treatments. What are the causes? This condition may be caused by:  Having trouble pooping (constipation).  Pushing hard (straining) to poop.  Watery poop (diarrhea).  Pregnancy.  Being very overweight (obese).  Sitting for long periods of time.  Heavy lifting or other activity that causes you to strain.  Anal sex.  Riding a bike for a long period of time. What are the signs or symptoms? Symptoms of this condition include:  Pain.  Itching or soreness in the butt.  Bleeding from the butt.  Leaking poop.  Swelling in the area.  One or more lumps around the opening of your butt. How is this diagnosed? A doctor can often diagnose this condition by looking at the affected area. The doctor may also:  Do an exam that involves feeling the area with a gloved hand (digital rectal exam).  Examine the area inside your butt using a small tube (anoscope).  Order blood tests. This may be done if you have lost a lot of blood.  Have you get a test that involves looking inside the colon using a flexible tube with a camera on the end (sigmoidoscopy or colonoscopy). How is this treated? This condition can usually be treated at home. Your doctor may tell you to change what you eat, make lifestyle changes, or try home treatments. If these do not help, procedures can be done to remove the hemorrhoids or make them smaller. These may involve:  Placing rubber bands at the base of the hemorrhoids to cut off their blood supply.  Injecting medicine into the hemorrhoids to shrink them.  Shining a type of light energy onto the hemorrhoids to cause them to fall off.  Doing surgery to remove the hemorrhoids or cut off their blood supply. Follow these instructions at home: Eating and drinking   Eat  foods that have a lot of fiber in them. These include whole grains, beans, nuts, fruits, and vegetables.  Ask your doctor about taking products that have added fiber (fibersupplements).  Reduce the amount of fat in your diet. You can do this by: ? Eating low-fat dairy products. ?  Eating less red meat. ? Avoiding processed foods.  Drink enough fluid to keep your pee (urine) pale yellow. Managing pain and swelling   Take a warm-water bath (sitz bath) for 20 minutes to ease pain. Do this 3-4 times a day. You may do this in a bathtub or using a portable sitz bath that fits over the toilet.  If told, put ice on the painful area. It may be helpful to use ice between your warm baths. ? Put ice in a plastic bag. ? Place a towel between your skin and the bag. ? Leave the ice on for 20 minutes, 2-3 times a day. General instructions  Take over-the-counter and prescription medicines only as told by your doctor. ? Medicated creams and medicines may be used as told.  Exercise often. Ask your doctor how much and what kind of exercise is best for you.  Go to the bathroom when you have the urge to poop. Do not wait.  Avoid pushing too hard when you poop.  Keep your butt dry and clean. Use wet toilet paper or moist towelettes after pooping.  Do not sit on the toilet for a long time.  Keep all follow-up visits as told by your doctor. This is important. Contact a doctor if you:  Have pain and swelling that do not get better with treatment or medicine.  Have trouble pooping.  Cannot poop.  Have pain or swelling outside the area of the hemorrhoids. Get help right away if you have:  Bleeding that will not stop. Summary  Hemorrhoids are swollen veins in the butt or around the opening of the butt.  They can cause pain, itching, or bleeding.  Eat foods that have a lot of fiber in them. These include whole grains, beans, nuts, fruits, and vegetables.  Take a warm-water bath (sitz bath)  for 20 minutes to ease pain. Do this 3-4 times a day. This information is not intended to replace advice given to you by your health care provider. Make sure you discuss any questions you have with your health care provider. Document Released: 11/16/2007 Document Revised: 02/14/2018 Document Reviewed: 06/28/2017 Elsevier Patient Education  2020 [place].     Upper Endoscopy, Adult, Care After This sheet gives you information about how to care for yourself after your procedure. Your health care provider may also give you more specific instructions. If you have problems or questions, contact your health care provider. What can I expect after the procedure? After the procedure, it is common to have:  A sore throat.  Mild stomach pain or discomfort.  Bloating.  Nausea. Follow these instructions at home:   Follow instructions from your health care provider about what to eat or drink after your procedure.  Return to your normal activities as told by your health care provider. Ask your health care provider what activities are safe for you.  Take over-the-counter and prescription medicines only as told by your health care provider.  Do not drive for 24 hours if you were given a sedative during your procedure.  Keep all follow-up visits as told by your health care provider. This is important. Contact a health care provider if you have:  A sore throat that lasts longer than one day.  Trouble swallowing. Get help right away if:  You vomit blood or your vomit looks like coffee grounds.  You have: ? A fever. ? Bloody, black, or tarry stools. ? A severe sore throat or you cannot swallow. ? Difficulty breathing. ?  Severe pain in your chest or abdomen. Summary  After the procedure, it is common to have a sore throat, mild stomach discomfort, bloating, and nausea.  Do not drive for 24 hours if you were given a sedative during the procedure.  Follow instructions from your  health care provider about what to eat or drink after your procedure.  Return to your normal activities as told by your health care provider. This information is not intended to replace advice given to you by your health care provider. Make sure you discuss any questions you have with your health care provider. Document Released: 08/08/2011 Document Revised: 07/31/2017 Document Reviewed: 07/09/2017 Elsevier Patient Education  2020 [place].   Hiatal Hernia  A hiatal hernia occurs when part of the stomach slides above the muscle that separates the abdomen from the chest (diaphragm). A person can be born with a hiatal hernia (congenital), or it may develop over time. In almost all cases of hiatal hernia, only the top part of the stomach pushes through the diaphragm. Many people have a hiatal hernia with no symptoms. The larger the hernia, the more likely it is that you will have symptoms. In some cases, a hiatal hernia allows stomach acid to flow back into the tube that carries food from your mouth to your stomach (esophagus). This may cause heartburn symptoms. Severe heartburn symptoms may mean that you have developed a condition called gastroesophageal reflux disease (GERD). What are the causes? This condition is caused by a weakness in the opening (hiatus) where the esophagus passes through the diaphragm to attach to the upper part of the stomach. A person may be born with a weakness in the hiatus, or a weakness can develop over time. What increases the risk? This condition is more likely to develop in:  Older people. Age is a major risk factor for a hiatal hernia, especially if you are over the age of 55.  Pregnant women.  People who are overweight.  People who have frequent constipation. What are the signs or symptoms? Symptoms of this condition usually develop in the form of GERD symptoms. Symptoms include:  Heartburn.  Belching.  Indigestion.  Trouble  swallowing.  Coughing or wheezing.  Sore throat.  Hoarseness.  Chest pain.  Nausea and vomiting. How is this diagnosed? This condition may be diagnosed during testing for GERD. Tests that may be done include:  X-rays of your stomach or chest.  An upper gastrointestinal (GI) series. This is an X-ray exam of your GI tract that is taken after you swallow a chalky liquid that shows up clearly on the X-ray.  Endoscopy. This is a procedure to look into your stomach using a thin, flexible tube that has a tiny camera and light on the end of it. How is this treated? This condition may be treated by:  Dietary and lifestyle changes to help reduce GERD symptoms.  Medicines. These may include: ? Over-the-counter antacids. ? Medicines that make your stomach empty more quickly. ? Medicines that block the production of stomach acid (H2 blockers). ? Stronger medicines to reduce stomach acid (proton pump inhibitors).  Surgery to repair the hernia, if other treatments are not helping. If you have no symptoms, you may not need treatment. Follow these instructions at home: Lifestyle and activity  Do not use any products that contain nicotine or tobacco, such as cigarettes and e-cigarettes. If you need help quitting, ask your health care provider.  Try to achieve and maintain a healthy body weight.  Avoid putting pressure on your abdomen. Anything that puts pressure on your abdomen increases the amount of acid that may be pushed up into your esophagus. ? Avoid bending over, especially after eating. ? Raise the head of your bed by putting blocks under the legs. This keeps your head and esophagus higher than your stomach. ? Do not wear tight clothing around your chest or stomach. ? Try not to strain when having a bowel movement, when urinating, or when lifting heavy objects. Eating and drinking  Avoid foods that can worsen GERD symptoms. These may include: ? Fatty foods, like fried  foods. ? Citrus fruits, like oranges or lemon. ? Other foods and drinks that contain acid, like orange juice or tomatoes. ? Spicy food. ? Chocolate.  Eat frequent small meals instead of three large meals a day. This helps prevent your stomach from getting too full. ? Eat slowly. ? Do not lie down right after eating. ? Do not eat 1-2 hours before bed.  Do not drink beverages with caffeine. These include cola, coffee, cocoa, and tea.  Do not drink alcohol. General instructions  Take over-the-counter and prescription medicines only as told by your health care provider.  Keep all follow-up visits as told by your health care provider. This is important. Contact a health care provider if:  Your symptoms are not controlled with medicines or lifestyle changes.  You are having trouble swallowing.  You have coughing or wheezing that will not go away. Get help right away if:  Your pain is getting worse.  Your pain spreads to your arms, neck, jaw, teeth, or back.  You have shortness of breath.  You sweat for no reason.  You feel sick to your stomach (nauseous) or you vomit.  You vomit blood.  You have bright red blood in your stools.  You have black, tarry stools. This information is not intended to replace advice given to you by your health care provider. Make sure you discuss any questions you have with your health care provider. Document Released: 04/29/2003 Document Revised: 01/19/2017 Document Reviewed: 09/11/2016 Elsevier Patient Education  2020 [employer].

## 2018-10-04 NOTE — Addendum Note (Signed)
Addendum  created 10/04/18 0957 by Mickel Baas, CRNA   Attestation recorded in [place], [place] filed

## 2018-10-04 NOTE — Op Note (Signed)
[facility]: Lambert Rhonda W Patient Name: Janice Stephens Procedure Date: 10/04/2018 9:03 AM MRN: [id number] Date of Birth: 10/30/1950 Attending MD: Hildred Laser , MD CSN: [id number] Age: 68 Admit Type: Outpatient Procedure:                Upper GI endoscopy Indications:              Epigastric abdominal pain, Follow-up of                            gastro-esophageal reflux disease Providers:                Hildred Laser, MD, Otis Peak B. Sharon Seller, RN, Raphael Gibney, Technician Referring MD:             [employer], MD Medicines:                Propofol per Anesthesia Complications:            No immediate complications. Estimated Blood Loss:     Estimated blood loss: none. Procedure:                Pre-Anesthesia Assessment:                           - Prior to the procedure, a History and Physical                            was performed, and patient medications and                            allergies were reviewed. The patient's tolerance of                            previous anesthesia was also reviewed. The risks                            and benefits of the procedure and the sedation                            options and risks were discussed with the patient.                            All questions were answered, and informed consent                            was obtained. Prior Anticoagulants: The patient has                            taken no previous anticoagulant or antiplatelet                            agents except for aspirin and has taken no previous  anticoagulant or antiplatelet agents except for                            NSAID medication. ASA Grade Assessment: II - A                            patient with mild systemic disease. After reviewing                            the risks and benefits, the patient was deemed in                            satisfactory condition to undergo the procedure.      After obtaining informed consent, the endoscope was                            passed under direct vision. Throughout the                            procedure, the patient's blood pressure, pulse, and                            oxygen saturations were monitored continuously. The                            GIF-H190 ([id number]) was introduced through the                            mouth, and advanced to the second part of duodenum.                            The upper GI endoscopy was accomplished without                            difficulty. The patient tolerated the procedure                            well. Scope In: 9:22:34 AM Scope Out: 9:30:22 AM Total Procedure Duration: 0 hours 7 minutes 48 seconds  Findings:      The examined esophagus was normal.      The Z-line was irregular and was found 35 cm from the incisors.      A 2 cm hiatal hernia was present.      Bilious fluid was found in the gastric body.      The entire examined stomach was normal.      The duodenal bulb and second portion of the duodenum were normal. Impression:               - Normal esophagus.                           - Z-line irregular, 35 cm from the incisors.                           - 2 cm hiatal  hernia.                           - Bilious gastric fluid.                           - Normal stomach.                           - Normal duodenal bulb and second portion of the                            duodenum.                           - No specimens collected. Moderate Sedation:      Per Anesthesia Care Recommendation:           - Patient has a contact number available for                            emergencies. The signs and symptoms of potential                            delayed complications were discussed with the                            patient. Return to normal activities tomorrow.                            Written discharge instructions were provided to the                             patient.                           - Resume previous diet today.                           - Continue present medications.                           - Return to GI clinic in 3 months. Procedure Code(s):        --- Professional ---                           [id number], Esophagogastroduodenoscopy, flexible,                            transoral; diagnostic, including collection of                            specimen(s) by brushing or washing, when performed                            (separate procedure) Diagnosis Code(s):        --- Professional ---  K22.8, Other specified diseases of esophagus                           K44.9, Diaphragmatic hernia without obstruction or                            gangrene                           R10.13, Epigastric pain                           K21.9, Gastro-esophageal reflux disease without                            esophagitis CPT copyright 2019 American [employer]. All rights reserved. The codes documented in this report are preliminary and upon coder review may  be revised to meet current compliance requirements. Hildred Laser, MD Hildred Laser, MD 10/04/2018 10:00:47 AM This report has been signed electronically. Number of Addenda: 0

## 2018-10-04 NOTE — H&P (Signed)
Janice Stephens is an 68 y.o. female.   Chief Complaint: Patient is here for EGD and colonoscopy. HPI: Patient is 68 year old Caucasian female who has several year history of GERD who has been on a PPI and also having to take famotidine.  She feels she is doing better with combination.  She also has intermittent epigastric pain.  She says pain is better since she stopped low-dose aspirin and is using meloxicam only on as-needed basis rather than every day.  She denies nausea vomiting or melena. She has irregular bowel movements.  She has diarrhea and/or constipation.  She has had 2 episodes of rectal bleeding in the last 2 months.  Each time she passed large amount of fresh blood.  She also has history of colonic polyps and last colonoscopy was in 2012. Family history is positive for colon carcinoma maternal grandfather and maternal aunt and they were both in their 1s and died of the disease within few months or couple of years.  Past Medical History:  Diagnosis Date  . GERD (gastroesophageal reflux disease)   . Hyperlipidemia   . Insomnia   . Major depressive disorder     Past Surgical History:  Procedure Laterality Date  . ABDOMINAL HYSTERECTOMY    . APPENDECTOMY    . CHOLECYSTECTOMY    . COLONOSCOPY  2012  . ORTHOPEDIC SURGERY     right and left knee  . TENDON REPAIR Right    Elbow  . TONSILLECTOMY    . UPPER GASTROINTESTINAL ENDOSCOPY      Family History  Problem Relation Age of Onset  . Heart disease Mother   . AAA (abdominal aortic aneurysm) Mother   . Heart disease Father   . Heart attack Father   . Breast cancer Neg Hx    Social History:  reports that she has quit smoking. Her smoking use included cigarettes. She has never used smokeless tobacco. She reports that she does not drink alcohol or use drugs.  Allergies:  Allergies  Allergen Reactions  . Doxycycline     Dizziness   . Sulfonamide Derivatives Nausea And Vomiting  . Zolpidem Tartrate     Sleep walking     Medications Prior to Admission  Medication Sig Dispense Refill  . acetaminophen (TYLENOL) 500 MG tablet Take 500 mg by mouth daily as needed for moderate pain or headache.    . ALPRAZolam (XANAX) 0.5 MG tablet Take 0.25-0.5 mg by mouth daily as needed for anxiety or sleep.     Marland Kitchen b complex vitamins tablet Take 1 tablet by mouth every evening.     Marland Kitchen CALCIUM PO Take 1,260 mg by mouth every evening.    . Cholecalciferol (VITAMIN D-3) 1000 units CAPS Take 1,000 Units by mouth every evening.     . Coenzyme Q10 (COQ-10) 100 MG CAPS Take 100 mg by mouth every evening.    . diphenhydrAMINE (BENADRYL) 50 MG tablet Take 50 mg by mouth at bedtime.     . hydrocortisone (ANUSOL-HC) 2.5 % rectal cream Place 1 application rectally daily as needed for pain.    Marland Kitchen lansoprazole (PREVACID) 15 MG capsule Take 15 mg by mouth daily.    . meloxicam (MOBIC) 7.5 MG tablet Take 7.5 mg by mouth daily with supper.     . mirtazapine (REMERON SOL-TAB) 30 MG disintegrating tablet Take 30 mg by mouth at bedtime.   11  . Multiple Vitamins-Minerals (MULTI COMPLETE PO) Take 1 tablet by mouth every evening.     Marland Kitchen  Omega 3-6-9 Fatty Acids (OMEGA 3-6-9 COMPLEX PO) Take 1 capsule by mouth every other day. Alternate taking fish oil 1200 mg one day with the omega 3 6 9  the next    . Omega-3 Fatty Acids (FISH OIL) 1200 MG CAPS Take 1,200 mg by mouth every other day. Alternate taking fish oil 1200 mg one day with the omega 3 6 9  the next    . oxymetazoline (AFRIN) 0.05 % nasal spray Place 1 spray into both nostrils 2 (two) times daily as needed for congestion.    . pravastatin (PRAVACHOL) 20 MG tablet Take 20 mg by mouth every evening.     . pyridOXINE (VITAMIN B-6) 100 MG tablet Take 100 mg by mouth every evening.     Marland Kitchen aspirin EC 81 MG tablet Take 81 mg by mouth every evening.       No results found for this or any previous visit (from the past 48 hour(s)). No results found.  ROS  There were no vitals taken for this  visit. Physical Exam  Constitutional: She appears well-developed and well-nourished.  HENT:  Mouth/Throat: Oropharynx is clear and moist.  Eyes: Conjunctivae are normal. No scleral icterus.  Neck: No thyromegaly present.  Cardiovascular: Normal rate, regular rhythm and normal heart sounds.  No murmur heard. Respiratory: Effort normal and breath sounds normal.  GI: Soft. She exhibits no distension and no mass. There is no abdominal tenderness.  Musculoskeletal:        General: No edema.  Neurological: She is alert.  Skin: Skin is warm and dry.     Assessment/Plan Chronic GERD. Epigastric pain. Rectal bleeding and history of polyps. Diagnostic EGD and colonoscopy.  Hildred Laser, MD 10/04/2018, 9:13 AM

## 2018-10-04 NOTE — Op Note (Signed)
[facility] Patient Name: Janice Stephens Procedure Date: 10/04/2018 9:32 AM MRN: [id number] Date of Birth: 26-Feb-1950 Attending MD: Hildred Laser , MD CSN: [id number] Age: 68 Admit Type: Outpatient Procedure:                Colonoscopy Indications:              Rectal bleeding Providers:                Hildred Laser, MD, Jeanann Lewandowsky. Sharon Seller, RN, Raphael Gibney, Technician Referring MD:             [employer], MD Medicines:                Propofol per Anesthesia Complications:            No immediate complications. Estimated Blood Loss:     Estimated blood loss: none. Procedure:                Pre-Anesthesia Assessment:                           - Prior to the procedure, a History and Physical                            was performed, and patient medications and                            allergies were reviewed. The patient's tolerance of                            previous anesthesia was also reviewed. The risks                            and benefits of the procedure and the sedation                            options and risks were discussed with the patient.                            All questions were answered, and informed consent                            was obtained. Prior Anticoagulants: The patient has                            taken no previous anticoagulant or antiplatelet                            agents except for aspirin and has taken no previous                            anticoagulant or antiplatelet agents except for  NSAID medication. ASA Grade Assessment: II - A                            patient with mild systemic disease. After reviewing                            the risks and benefits, the patient was deemed in                            satisfactory condition to undergo the procedure.                           - Prior to the procedure, a History and Physical                            was  performed, and patient medications and                            allergies were reviewed. The patient's tolerance of                            previous anesthesia was also reviewed. The risks                            and benefits of the procedure and the sedation                            options and risks were discussed with the patient.                            All questions were answered, and informed consent                            was obtained. ASA Grade Assessment: II - A patient                            with mild systemic disease. After reviewing the                            risks and benefits, the patient was deemed in                            satisfactory condition to undergo the procedure.                           After obtaining informed consent, the colonoscope                            was passed under direct vision. Throughout the                            procedure, the patient's blood pressure, pulse, and  oxygen saturations were monitored continuously. The                            [id number]-H190DL ([id number]) scope was introduced through                            the anus and advanced to the the cecum, identified                            by appendiceal orifice and ileocecal valve. The                            colonoscopy was performed without difficulty. The                            patient tolerated the procedure well. The quality                            of the bowel preparation was excellent. The                            ileocecal valve, appendiceal orifice, and rectum                            were photographed. Scope In: 9:35:00 AM Scope Out: 9:48:00 AM Scope Withdrawal Time: 0 hours 8 minutes 57 seconds  Total Procedure Duration: 0 hours 13 minutes 0 seconds  Findings:      Skin tags were found on perianal exam.      A few small and medium diverticula were found in the entire colon.      External and internal  hemorrhoids were found during retroflexion. The       hemorrhoids were medium-sized. Impression:               - Perianal skin tags found on perianal exam.                           - Diverticulosis in the entire examined colon.                            small diverticula at sigmoid and mdium size                            diverticula in proximal colon.                           - External and internal hemorrhoids.                           - No specimens collected. Moderate Sedation:      Per Anesthesia Care Recommendation:           - Patient has a contact number available for                            emergencies. The signs and symptoms of potential  delayed complications were discussed with the                            patient. Return to normal activities tomorrow.                            Written discharge instructions were provided to the                            patient.                           - High fiber diet today.                           - Continue present medications.                           - Repeat colonoscopy in 7 years for surveillance. Procedure Code(s):        --- Professional ---                           [id number], Colonoscopy, flexible; diagnostic, including                            collection of specimen(s) by brushing or washing,                            when performed (separate procedure) Diagnosis Code(s):        --- Professional ---                           K64.8, Other hemorrhoids                           K64.4, Residual hemorrhoidal skin tags                           K62.5, Hemorrhage of anus and rectum                           K57.30, Diverticulosis of large intestine without                            perforation or abscess without bleeding CPT copyright 2019 American Medical Association. All rights reserved. The codes documented in this report are preliminary and upon coder review may  be revised to meet  current compliance requirements. Hildred Laser, MD Hildred Laser, MD 10/04/2018 10:06:41 AM This report has been signed electronically. Number of Addenda: 0

## 2018-10-04 NOTE — Anesthesia Preprocedure Evaluation (Signed)
Anesthesia Evaluation  Patient identified by MRN, date of birth, ID band Patient awake    Reviewed: Allergy & Precautions, NPO status , Patient's Chart, lab work & pertinent test results  Airway Mallampati: III  TM Distance: >3 FB Neck ROM: Full    Dental no notable dental hx. (+) Teeth Intact   Pulmonary neg pulmonary ROS, former smoker,    Pulmonary exam normal breath sounds clear to auscultation       Cardiovascular Exercise Tolerance: Good negative cardio ROS Normal cardiovascular examI Rhythm:Regular Rate:Normal     Neuro/Psych Seizures -, Well Controlled,  PSYCHIATRIC DISORDERS Anxiety Depression States one Sz over 10 years prior -states related to medications -none since -no current Sz meds     GI/Hepatic Neg liver ROS, GERD  Medicated and Controlled,  Endo/Other  negative endocrine ROS  Renal/GU negative Renal ROS  negative genitourinary   Musculoskeletal negative musculoskeletal ROS (+)   Abdominal   Peds negative pediatric ROS (+)  Hematology negative hematology ROS (+)   Anesthesia Other Findings   Reproductive/Obstetrics negative OB ROS                             Anesthesia Physical Anesthesia Plan  ASA: II  Anesthesia Plan: General   Post-op Pain Management:    Induction: Intravenous  PONV Risk Score and Plan: 3 and Propofol infusion, TIVA, Ondansetron and Treatment may vary due to age or medical condition  Airway Management Planned: Nasal Cannula and Simple Face Mask  Additional Equipment:   Intra-op Plan:   Post-operative Plan:   Informed Consent: I have reviewed the patients History and Physical, chart, labs and discussed the procedure including the risks, benefits and alternatives for the proposed anesthesia with the patient or authorized representative who has indicated his/her understanding and acceptance.     Dental advisory given  Plan Discussed  with: CRNA  Anesthesia Plan Comments: (Plan Full PPE use  Plan GA with GETA as needed d/w pt -WTP with same after Q&A)        Anesthesia Quick Evaluation

## 2018-10-04 NOTE — Transfer of Care (Signed)
Immediate Anesthesia Transfer of Care Note  Patient: Janice Stephens  Procedure(s) Performed: ESOPHAGOGASTRODUODENOSCOPY (EGD) WITH PROPOFOL (N/A ) COLONOSCOPY WITH PROPOFOL (N/A )  Patient Location: PACU  Anesthesia Type:General  Level of Consciousness: awake, alert , oriented and patient cooperative  Airway & Oxygen Therapy: Patient Spontanous Breathing  Post-op Assessment: Report given to RN and Post -op Vital signs reviewed and stable  Post vital signs: Reviewed and stable  Last Vitals:  Vitals Value Taken Time  BP 104/81 10/04/18 0953  Temp    Pulse 103 10/04/18 0953  Resp 9 10/04/18 0953  SpO2 100 % 10/04/18 0953  Vitals shown include unvalidated device data.  Last Pain:  Vitals:   10/04/18 0918  PainSc: 0-No pain         Complications: No apparent anesthesia complications

## 2018-10-04 NOTE — Anesthesia Postprocedure Evaluation (Signed)
Anesthesia Post Note  Patient: Janice Stephens  Procedure(s) Performed: ESOPHAGOGASTRODUODENOSCOPY (EGD) WITH PROPOFOL (N/A ) COLONOSCOPY WITH PROPOFOL (N/A )  Patient location during evaluation: PACU Anesthesia Type: General Level of consciousness: awake and alert and oriented Pain management: pain level controlled Vital Signs Assessment: post-procedure vital signs reviewed and stable Respiratory status: spontaneous breathing Cardiovascular status: stable Postop Assessment: no apparent nausea or vomiting Anesthetic complications: no     Last Vitals: There were no vitals filed for this visit.  Last Pain:  Vitals:   10/04/18 0918  PainSc: 0-No pain                 Lateshia Schmoker A

## 2018-10-11 ENCOUNTER — Encounter (HOSPITAL_COMMUNITY): Payer: Self-pay | Admitting: Internal Medicine

## 2018-12-30 ENCOUNTER — Ambulatory Visit (INDEPENDENT_AMBULATORY_CARE_PROVIDER_SITE_OTHER): Payer: Medicare Other | Admitting: Internal Medicine

## 2018-12-30 ENCOUNTER — Encounter (INDEPENDENT_AMBULATORY_CARE_PROVIDER_SITE_OTHER): Payer: Self-pay | Admitting: Internal Medicine

## 2018-12-30 ENCOUNTER — Other Ambulatory Visit: Payer: Self-pay

## 2018-12-30 VITALS — BP 136/90 | HR 80 | Temp 98.0°F | Ht 63.0 in | Wt 154.5 lb

## 2018-12-30 DIAGNOSIS — K921 Melena: Secondary | ICD-10-CM | POA: Insufficient documentation

## 2018-12-30 DIAGNOSIS — R1011 Right upper quadrant pain: Secondary | ICD-10-CM

## 2018-12-30 DIAGNOSIS — K219 Gastro-esophageal reflux disease without esophagitis: Secondary | ICD-10-CM | POA: Diagnosis not present

## 2018-12-30 NOTE — Patient Instructions (Signed)
Please keep diary as to frequency of rectal bleeding episodes and call if they become frequent or if bleed is significant. We will check LFTs if you have another episode of moderate to severe right upper quadrant abdominal pain.

## 2018-12-30 NOTE — Progress Notes (Signed)
Presenting complaint;  Follow-up for rectal bleeding. Right upper quadrant abdominal pain.  Database and subjective:  Patient is 68 year old Caucasian female who was evaluated back in July for rectal bleeding and right upper quadrant pain. She underwent EGD and colonoscopy on 10/04/2018. EGD revealed small sliding hiatal hernia but no evidence of peptic ulcer disease or gastritis. Colonoscopy revealed scattered diverticula throughout the colon as well as internal and external hemorrhoids. She also had abdominal pelvic CT following her EGD and was unremarkable. Patient was advised to keep symptom diary and now she returns for follow-up visit. She says she is doing very well as far as bleeding episodes are concerned and she only had one episode since her colonoscopy 12 weeks ago.  She only noted small amount of blood with her bowel movement.  Her bowels move almost daily.  She may skip a day here and there.  She does not feel she is constipated. She continues to experience right upper quadrant abdominal pain.  She has had this pain off and on for 3 years but she feels she is having these attacks less often and is not as intense.  Last episode was about 2 months ago and it was not bad.  She does not remember if she had nausea vomiting diaphoresis or migration of pain to other areas.  She recalls she also woke up once in the middle of night with this pain.  She says this pain may last from 10 to 20 minutes. She has heartburn no more than once a week and is not bad and generally relieved with OTC antacid. Her appetite is good and her weight has been stable.   Current Medications: Outpatient Encounter Medications as of 12/30/2018  Medication Sig  . acetaminophen (TYLENOL) 500 MG tablet Take 500 mg by mouth daily as needed for moderate pain or headache.  . ALPRAZolam (XANAX) 0.5 MG tablet Take 0.25-0.5 mg by mouth daily as needed for anxiety or sleep.   Marland Kitchen b complex vitamins tablet Take 1 tablet by  mouth every evening.   Marland Kitchen CALCIUM PO Take 1,260 mg by mouth every evening.  . Cholecalciferol (VITAMIN D-3) 1000 units CAPS Take 1,000 Units by mouth every evening.   . Coenzyme Q10 (COQ-10) 100 MG CAPS Take 100 mg by mouth every evening.  . diphenhydrAMINE (BENADRYL) 50 MG tablet Take 50 mg by mouth at bedtime.   . famotidine (PEPCID) 20 MG tablet Take 1 tablet (20 mg total) by mouth at bedtime as needed for heartburn or indigestion.  . hydrocortisone (ANUSOL-HC) 2.5 % rectal cream Place 1 application rectally daily as needed for pain.  Marland Kitchen lansoprazole (PREVACID) 15 MG capsule Take 15 mg by mouth daily.  . meloxicam (MOBIC) 7.5 MG tablet Take 7.5 mg by mouth daily with supper.   . mirtazapine (REMERON SOL-TAB) 30 MG disintegrating tablet Take 30 mg by mouth at bedtime.   . Multiple Vitamins-Minerals (MULTI COMPLETE PO) Take 1 tablet by mouth every evening.   . Omega 3-6-9 Fatty Acids (OMEGA 3-6-9 COMPLEX PO) Take 1 capsule by mouth every other day. Alternate taking fish oil 1200 mg one day with the omega 3 6 9  the next  . Omega-3 Fatty Acids (FISH OIL) 1200 MG CAPS Take 1,200 mg by mouth every other day. Alternate taking fish oil 1200 mg one day with the omega 3 6 9  the next  . oxymetazoline (AFRIN) 0.05 % nasal spray Place 1 spray into both nostrils 2 (two) times daily as needed for congestion.  Marland Kitchen  Polyethylene Glycol 3350 (MIRALAX PO) Take by mouth. Patient states that she uses 2 teaspoons daily.  . pravastatin (PRAVACHOL) 20 MG tablet Take 20 mg by mouth every evening.   . psyllium (METAMUCIL) 58.6 % powder Take 1 packet by mouth. Patient states hat she takes at least 2 teaspoons daily.  Marland Kitchen pyridOXINE (VITAMIN B-6) 100 MG tablet Take 100 mg by mouth every evening.   Marland Kitchen aspirin EC 81 MG tablet Take 81 mg by mouth every evening.   . [DISCONTINUED] Wheat Dextrin (BENEFIBER DRINK MIX) PACK Take 4 g by mouth at bedtime. (Patient not taking: Reported on 12/30/2018)   No facility-administered encounter  medications on file as of 12/30/2018.      Objective: Blood pressure 136/90, pulse 80, temperature 98 F (36.7 C), temperature source Oral, height 5\' 3"  (1.6 m), weight 154 lb 8 oz (70.1 kg). Patient is alert and in no acute distress. She is wearing facial mask. Conjunctiva is pink. Sclera is nonicteric Oropharyngeal mucosa is normal. No neck masses or thyromegaly noted. Cardiac exam with regular rhythm normal S1 and S2. No murmur or gallop noted. Lungs are clear to auscultation. Abdomen is symmetrical and soft.  She has mild hypogastric tenderness.  No organomegaly or masses. No LE edema or clubbing noted.  Lab data. Abdominal pelvic CT on 10/06/2018 Status post cholecystectomy.  Bile duct not dilated. No abnormality noted to bile duct liver or pancreas.  No adenopathy either.   Assessment:  #1.  Hematochezia secondary to hemorrhoids well-documented on colonoscopy of 10/04/2018.  Bleeding frequency seems to have dropped.  Therefore would not recommend further therapy of hemorrhoids at this time.  #2.  Right upper quadrant abdominal pain.  EGD and abdominal pelvic CT unremarkable.  Source of this pain is not clear but may be biliary or musculoskeletal.  #3.  Chronic GERD.  She is doing well with therapy.  Plan:  Patient will continue keep symptom diary as to frequency of bleeding episodes as well as right upper quadrant abdominal pain. Would consider LFTs if she has another episode of significant right upper quadrant abdominal pain. Office visit in 1 year or earlier if necessary.

## 2019-04-13 ENCOUNTER — Ambulatory Visit: Payer: Medicare Other | Attending: Internal Medicine

## 2019-04-13 DIAGNOSIS — Z23 Encounter for immunization: Secondary | ICD-10-CM | POA: Insufficient documentation

## 2019-04-13 NOTE — Progress Notes (Signed)
   Covid-19 Vaccination Clinic  Name:  Janice Stephens    MRN: [id number] DOB: 1951/01/04  04/13/2019  [name] was observed post Covid-19 immunization for 15 minutes without incidence. She was provided with Vaccine Information Sheet and instruction to access the V-Safe system.   [name] was instructed to call 911 with any severe reactions post vaccine: Marland Kitchen Difficulty breathing  . Swelling of your face and throat  . A fast heartbeat  . A bad rash all over your body  . Dizziness and weakness    Immunizations Administered    Name Date Dose VIS Date Route   Pfizer COVID-19 Vaccine 04/13/2019  1:00 PM 0.3 mL 01/31/2019 Intramuscular   Manufacturer: [place]   Lot: [id number]   [place]: [id number]

## 2019-05-07 ENCOUNTER — Ambulatory Visit: Payer: Medicare Other | Attending: Internal Medicine

## 2019-05-07 DIAGNOSIS — Z23 Encounter for immunization: Secondary | ICD-10-CM

## 2019-05-07 NOTE — Progress Notes (Signed)
   Covid-19 Vaccination Clinic  Name:  Janice Stephens    MRN: [id number] DOB: 11-19-1950  05/07/2019  [name] was observed post Covid-19 immunization for 15 minutes without incident. She was provided with Vaccine Information Sheet and instruction to access the V-Safe system.   [name] was instructed to call 911 with any severe reactions post vaccine: Marland Kitchen Difficulty breathing  . Swelling of face and throat  . A fast heartbeat  . A bad rash all over body  . Dizziness and weakness   Immunizations Administered    Name Date Dose VIS Date Route   Pfizer COVID-19 Vaccine 05/07/2019  8:44 AM 0.3 mL 01/31/2019 Intramuscular   Manufacturer: [place]   Lot: [id number]   [place]: [id number]

## 2019-06-18 ENCOUNTER — Other Ambulatory Visit: Payer: Self-pay | Admitting: Internal Medicine

## 2019-06-18 DIAGNOSIS — Z1231 Encounter for screening mammogram for malignant neoplasm of breast: Secondary | ICD-10-CM

## 2019-06-25 ENCOUNTER — Ambulatory Visit
Admission: RE | Admit: 2019-06-25 | Discharge: 2019-06-25 | Disposition: A | Discharge status: Home / Self Care | Payer: Medicare Other | Source: Ambulatory Visit | Attending: Internal Medicine | Admitting: Internal Medicine

## 2019-06-25 ENCOUNTER — Other Ambulatory Visit: Payer: Self-pay

## 2019-06-25 DIAGNOSIS — Z1231 Encounter for screening mammogram for malignant neoplasm of breast: Secondary | ICD-10-CM

## 2019-11-27 ENCOUNTER — Ambulatory Visit: Payer: Medicare Other

## 2019-12-30 ENCOUNTER — Ambulatory Visit (INDEPENDENT_AMBULATORY_CARE_PROVIDER_SITE_OTHER): Payer: Medicare Other | Admitting: Internal Medicine

## 2019-12-30 ENCOUNTER — Encounter (INDEPENDENT_AMBULATORY_CARE_PROVIDER_SITE_OTHER): Payer: Self-pay | Admitting: Internal Medicine

## 2019-12-30 ENCOUNTER — Other Ambulatory Visit: Payer: Self-pay

## 2019-12-30 VITALS — BP 134/77 | HR 78 | Temp 98.4°F | Ht 63.0 in | Wt 153.8 lb

## 2019-12-30 DIAGNOSIS — K921 Melena: Secondary | ICD-10-CM | POA: Diagnosis not present

## 2019-12-30 DIAGNOSIS — K219 Gastro-esophageal reflux disease without esophagitis: Secondary | ICD-10-CM | POA: Diagnosis not present

## 2019-12-30 MED ORDER — MAGNESIUM 500 MG PO CAPS: 500.0000 mg | ORAL_CAPSULE | Freq: Every day | ORAL | 0 refills | Status: DC

## 2019-12-30 MED ORDER — PANTOPRAZOLE SODIUM 40 MG PO TBEC: 40.0000 mg | DELAYED_RELEASE_TABLET | Freq: Every day | ORAL | 5 refills | Status: DC

## 2019-12-30 NOTE — Patient Instructions (Signed)
Please call office with progress report in 4 weeks. Remember to take pantoprazole 30 to 45 minutes before breakfast. Please notify if swallowing difficulty worsens.

## 2019-12-30 NOTE — Progress Notes (Signed)
Presenting complaint;  Follow-up for GERD. Patient complains of dysphagia and hematochezia.  Database and subjective:  Patient is 69 year old Caucasian female who is here for yearly visit.  She was last seen 1 year ago when she was complaining of right upper quadrant abdominal pain and rectal bleeding.  She had undergone EGD and colonoscopy about 3 months prior to that visit.  EGD revealed small hiatal hernia but no evidence of peptic ulcer disease or gastritis.  Colonoscopy revealed pancolonic diverticulosis as well as prominent internal and external hemorrhoids. She also had abdominal pelvic CT and no abnormality was noted to account for her right upper quadrant abdominal pain.  I wonder if she had musculoskeletal pain.  She says she is doing well.  Now she may have occasional pressure in the right rib cage.  She has not had severe cramping like she used to.  Since she has been taking fiber supplement her bowels have been moving every day.  She may see blood once every 3 months and is usually small in the form of blood in the tissue. She says she is taking magnesium for preventive purposes.  She is taking 1 g daily.  She does not feel she is having any side effects. She is having heartburn at least 2-3 times a week.  She does not take famotidine often for breakthrough symptoms..  She has noted dysphagia with pills.  She also has difficulty with meat or chocolate but she does not eat these foods.  She does not feel dysphagia is bad enough to to warrant further evaluation.  Her appetite is good and her weight has been stable. She says she developed headache fever and chills after first dose of Covid vaccine.  She did better after the second dose. 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   Current Medications: Outpatient Encounter Medications as of 12/30/2019  Medication Sig  . acetaminophen (TYLENOL) 500 MG tablet Take 500 mg by mouth daily as needed for moderate  pain or headache.  Marland Kitchen acyclovir (ZOVIRAX) 400 MG tablet Take 400 mg by mouth daily.  Marland Kitchen ALPRAZolam (XANAX) 0.5 MG tablet Take 0.5 mg by mouth 3 (three) times daily as needed for anxiety or sleep.   . Ascorbic Acid (VITAMIN C WITH ROSE HIPS) 500 MG tablet Take 500 mg by mouth daily.  Marland Kitchen b complex vitamins tablet Take 1 tablet by mouth every evening.   Marland Kitchen CALCIUM PO Take 1,260 mg by mouth every evening.  . Cholecalciferol (VITAMIN D-3) 1000 units CAPS Take 1,000 Units by mouth every evening.   . Coenzyme Q10 (COQ-10) 100 MG CAPS Take 100 mg by mouth every evening.  . famotidine (PEPCID) 20 MG tablet Take 1 tablet (20 mg total) by mouth at bedtime as needed for heartburn or indigestion. (Patient taking differently: Take 10 mg by mouth at bedtime as needed for heartburn or indigestion. )  . MAGNESIUM CHLORIDE PO Take 1,000 mg by mouth daily.  . Melatonin-Pyridoxine (MELATONEX PO) Take 3 mg by mouth at bedtime.  . meloxicam (MOBIC) 7.5 MG tablet Take 7.5 mg by mouth daily with supper.   . mirtazapine (REMERON SOL-TAB) 30 MG disintegrating tablet Take 30 mg by mouth at bedtime.   . Multiple Vitamins-Minerals (MULTI COMPLETE PO) Take 1 tablet by mouth every evening.   . Omega 3-6-9 Fatty Acids (OMEGA 3-6-9 COMPLEX PO) Take 1 capsule by mouth daily.   Marland Kitchen omeprazole (PRILOSEC) 20 MG capsule Take 20 mg by mouth daily.  Marland Kitchen  oxymetazoline (AFRIN) 0.05 % nasal spray Place 1 spray into both nostrils 2 (two) times daily as needed for congestion.  . Polyethylene Glycol 3350 (MIRALAX PO) Take by mouth. Patient states that she uses 2 teaspoons daily.  . pravastatin (PRAVACHOL) 20 MG tablet Take 20 mg by mouth every evening.   . psyllium (METAMUCIL) 58.6 % powder Take 1 packet by mouth. Patient states hat she takes at least 2 teaspoons daily.  Marland Kitchen pyridOXINE (VITAMIN B-6) 100 MG tablet Take 100 mg by mouth every evening.   . Quercetin 250 MG TABS Take 500 mg by mouth daily.  . Turmeric (QC TUMERIC COMPLEX) 500 MG CAPS  Take 500 mg by mouth daily.  . Vitamin D-Vitamin K (K2 PLUS D3 PO) Take 100 mcg by mouth daily.  Marland Kitchen zinc gluconate 50 MG tablet Take 50 mg by mouth daily.  . [DISCONTINUED] Omega-3 Fatty Acids (FISH OIL) 1200 MG CAPS Take 1,200 mg by mouth daily.   . [DISCONTINUED] aspirin EC 81 MG tablet Take 81 mg by mouth every evening.   . [DISCONTINUED] diphenhydrAMINE (BENADRYL) 50 MG tablet Take 50 mg by mouth at bedtime.   . [DISCONTINUED] lansoprazole (PREVACID) 15 MG capsule Take 15 mg by mouth daily.   No facility-administered encounter medications on file as of 12/30/2019.     Objective: Blood pressure 134/77, pulse 78, temperature 98.4 F (36.9 C), temperature source Oral, height 5\' 3"  (1.6 m), weight 153 lb 12.8 oz (69.8 kg). Patient is alert and in no acute distress. She is wearing a mask. Conjunctiva is pink. Sclera is nonicteric Oropharyngeal mucosa is normal. No neck masses or thyromegaly noted. Cardiac exam with regular rhythm normal S1 and S2. No murmur or gallop noted. Lungs are clear to auscultation. Abdomen is symmetrical soft and nontender with organomegaly or masses. No LE edema or clubbing noted.  Assessment:  #1.  Chronic GERD.  Symptoms are not well controlled with omeprazole.  It is time to switch to another PPI.  #2.  Dysphagia primarily to pills.  She had EGD last year and there was no structural abnormality.  If dysphagia worsens we will consider barium study.  #3.  Hematochezia secondary to hemorrhoids well documented on colonoscopy of April 2020.  She is doing well with fiber supplement.  I wonder if magnesium is also helping prevent constipation but feels she is taking too much magnesium.  Plan:  Discontinue omeprazole. Begin pantoprazole 40 mg by mouth 30 minutes before breakfast daily. Patient will call with progress report in 4 weeks. Decrease magnesium dose to 500 mg p.o. daily. Patient will call office if dysphagia worsens. Office visit in 1  year.

## 2020-01-22 ENCOUNTER — Ambulatory Visit: Payer: Medicare Other | Attending: Internal Medicine

## 2020-01-22 DIAGNOSIS — Z23 Encounter for immunization: Secondary | ICD-10-CM

## 2020-01-22 NOTE — Progress Notes (Signed)
   Covid-19 Vaccination Clinic  Name:  Janice Stephens    MRN: [id number] DOB: 09/06/1950  01/22/2020  Janice Stephens was observed post Covid-19 immunization for 15 minutes without incident. She was provided with Vaccine Information Sheet and instruction to access the V-Safe system.   Janice Stephens was instructed to call 911 with any severe reactions post vaccine: Marland Kitchen Difficulty breathing  . Swelling of face and throat  . A fast heartbeat  . A bad rash all over body  . Dizziness and weakness   Immunizations Administered    Name Date Dose VIS Date Route   Pfizer COVID-19 Vaccine 01/22/2020  2:30 PM 0.3 mL 12/10/2019 Intramuscular   Manufacturer: [place]   Lot: [id number]   [place]: [id number]

## 2020-06-07 ENCOUNTER — Other Ambulatory Visit (INDEPENDENT_AMBULATORY_CARE_PROVIDER_SITE_OTHER): Payer: Self-pay | Admitting: Internal Medicine

## 2020-08-04 ENCOUNTER — Other Ambulatory Visit: Payer: Self-pay | Admitting: Internal Medicine

## 2020-08-04 DIAGNOSIS — Z1231 Encounter for screening mammogram for malignant neoplasm of breast: Secondary | ICD-10-CM

## 2020-09-27 ENCOUNTER — Other Ambulatory Visit: Payer: Self-pay

## 2020-09-27 ENCOUNTER — Ambulatory Visit
Admission: RE | Admit: 2020-09-27 | Discharge: 2020-09-27 | Disposition: A | Discharge status: Home / Self Care | Payer: Medicare Other | Source: Ambulatory Visit | Attending: Internal Medicine | Admitting: Internal Medicine

## 2020-09-27 DIAGNOSIS — Z1231 Encounter for screening mammogram for malignant neoplasm of breast: Secondary | ICD-10-CM

## 2020-12-03 ENCOUNTER — Other Ambulatory Visit (INDEPENDENT_AMBULATORY_CARE_PROVIDER_SITE_OTHER): Payer: Self-pay | Admitting: Internal Medicine

## 2020-12-27 ENCOUNTER — Encounter (INDEPENDENT_AMBULATORY_CARE_PROVIDER_SITE_OTHER): Payer: Self-pay | Admitting: Gastroenterology

## 2020-12-27 ENCOUNTER — Ambulatory Visit (INDEPENDENT_AMBULATORY_CARE_PROVIDER_SITE_OTHER): Payer: Medicare Other | Admitting: Gastroenterology

## 2020-12-27 ENCOUNTER — Other Ambulatory Visit: Payer: Self-pay

## 2020-12-27 VITALS — BP 147/92 | HR 87 | Temp 98.5°F | Ht 62.0 in | Wt 148.0 lb

## 2020-12-27 DIAGNOSIS — K219 Gastro-esophageal reflux disease without esophagitis: Secondary | ICD-10-CM

## 2020-12-27 DIAGNOSIS — K589 Irritable bowel syndrome without diarrhea: Secondary | ICD-10-CM | POA: Insufficient documentation

## 2020-12-27 DIAGNOSIS — R140 Abdominal distension (gaseous): Secondary | ICD-10-CM | POA: Diagnosis not present

## 2020-12-27 DIAGNOSIS — K581 Irritable bowel syndrome with constipation: Secondary | ICD-10-CM | POA: Diagnosis not present

## 2020-12-27 NOTE — Progress Notes (Signed)
Maylon Peppers, M.D. Gastroenterology & Hepatology [facility] [address] [place], [place] [address]  Primary Care Physician: Redmond School, MD [address] [place] [address]  I will communicate my assessment and recommendations to the referring MD via EMR.  Problems: GERD Bloating  History of Present Illness: Janice Stephens is a 70 y.o. female with past medical history of GERD, hyperlipidemia, depression, who presents for follow up of GERD and bloating.  The patient was last seen on 12/30/2019. At that time, the patient was switched to pantoprazole 40 mg every day as her GERD was not controlled with omeprazole.  She was also advised to decrease intake of magnesium to 500 mg every day.  She had presence of dysphagia at that time it was very mild in nature.  Patient reports that she has presented recurrent bloating for the last few months. She believes it may be related to her Metamucil - has been taking 2 spoonfuls for multiple years. She also takes 1 capful of Miralax every day, has been taking it for multiple years. She has a bowwel movement every 1-2 days and reports that her current laxative regimen has been working but sometimes feels significant discomfort during the day, even if she has regular bowel movements.  She reports having heartburn once every 2-3 weeks despite taking pantoprazole 40 mg every day. When she presens these episodes, she takes Pepcid, or Tums which usually helps. No dysphagia or odynophagia.  The patient denies having any nausea, vomiting, fever, chills, frequent hematochezia (had intermittent bleeding due to history of hemorrhoids), melena, hematemesis, abdominal distention, abdominal pain, diarrhea, jaundice, pruritus. Has lost 5 lb as she has been more active recently.  Last EGD: 10/04/2018, irregular Z-line at 35 cm, 2 cm hiatal hernia, otherwise normal Last Colonoscopy: 10/04/2018,  diverticulosis, external/internal hemorrhoids.  Past Medical History: Past Medical History:  Diagnosis Date   GERD (gastroesophageal reflux disease)    Hyperlipidemia    Insomnia    Major depressive disorder     Past Surgical History: Past Surgical History:  Procedure Laterality Date   ABDOMINAL HYSTERECTOMY     APPENDECTOMY     CHOLECYSTECTOMY     COLONOSCOPY  2012   COLONOSCOPY WITH PROPOFOL N/A 10/04/2018   Procedure: COLONOSCOPY WITH PROPOFOL;  Surgeon: Rogene Houston, MD;  Location: [facility];  Service: Endoscopy;  Laterality: N/A;   ESOPHAGOGASTRODUODENOSCOPY (EGD) WITH PROPOFOL N/A 10/04/2018   Procedure: ESOPHAGOGASTRODUODENOSCOPY (EGD) WITH PROPOFOL;  Surgeon: Rogene Houston, MD;  Location: [facility];  Service: Endoscopy;  Laterality: N/A;  9:30   ORTHOPEDIC SURGERY     right and left knee   TENDON REPAIR Right    Elbow   TONSILLECTOMY     UPPER GASTROINTESTINAL ENDOSCOPY      Family History: Family History  Problem Relation Age of Onset   Heart disease Mother    AAA (abdominal aortic aneurysm) Mother    Heart disease Father    Heart attack Father    Breast cancer Neg Hx     Social History: Social History   Tobacco Use  Smoking Status Former   Types: Cigarettes  Smokeless Tobacco Never   Social History   Substance and Sexual Activity  Alcohol Use Yes   Comment: occasionally   Social History   Substance and Sexual Activity  Drug Use No    Allergies: Allergies  Allergen Reactions   Doxycycline     Dizziness    Sulfonamide Derivatives  Nausea And Vomiting   Zolpidem Tartrate     Sleep walking    Medications: Current Outpatient Medications  Medication Sig Dispense Refill   acetaminophen (TYLENOL) 500 MG tablet Take 500 mg by mouth daily as needed for moderate pain or headache.     acyclovir (ZOVIRAX) 400 MG tablet Take 400 mg by mouth daily.     ALPRAZolam (XANAX) 0.5 MG tablet Take 0.5 mg by mouth 3 (three) times daily as  needed for anxiety or sleep.      b complex vitamins tablet Take 1 tablet by mouth every evening.      CALCIUM PO Take 1,260 mg by mouth every evening.     Cholecalciferol (VITAMIN D-3) 1000 units CAPS Take 1,000 Units by mouth as needed.     Coenzyme Q10 (COQ-10) 100 MG CAPS Take 100 mg by mouth every evening.     famotidine (PEPCID) 20 MG tablet Take 1 tablet (20 mg total) by mouth at bedtime as needed for heartburn or indigestion. (Patient taking differently: Take 10 mg by mouth at bedtime as needed for heartburn or indigestion.)     Magnesium 500 MG CAPS Take 1 capsule (500 mg total) by mouth daily after supper.  0   Melatonin-Pyridoxine (MELATONEX PO) Take 3 mg by mouth at bedtime.     meloxicam (MOBIC) 7.5 MG tablet Take 7.5 mg by mouth daily with supper.      mirtazapine (REMERON SOL-TAB) 30 MG disintegrating tablet Take 30 mg by mouth at bedtime.   11   oxymetazoline (AFRIN) 0.05 % nasal spray Place 1 spray into both nostrils 2 (two) times daily as needed for congestion.     pantoprazole (PROTONIX) 40 MG tablet TAKE 1 TABLET BY MOUTH EVERY DAY BEFORE BREAKFAST 30 tablet 5   Polyethylene Glycol 3350 (MIRALAX PO) Take by mouth. Patient states that she uses 2 teaspoons daily.     pravastatin (PRAVACHOL) 20 MG tablet Take 20 mg by mouth every evening.      psyllium (METAMUCIL) 58.6 % powder Take 1 packet by mouth. Patient states hat she takes at least 2 teaspoons daily.     pyridOXINE (VITAMIN B-6) 100 MG tablet Take 100 mg by mouth every evening.      Quercetin 250 MG TABS Take 500 mg by mouth daily.     zinc gluconate 50 MG tablet Take 50 mg by mouth daily.     No current facility-administered medications for this visit.    Review of Systems: GENERAL: negative for malaise, night sweats HEENT: No changes in hearing or vision, no nose bleeds or other nasal problems. NECK: Negative for lumps, goiter, pain and significant neck swelling RESPIRATORY: Negative for cough,  wheezing CARDIOVASCULAR: Negative for chest pain, leg swelling, palpitations, orthopnea GI: SEE HPI MUSCULOSKELETAL: Negative for joint pain or swelling, back pain, and muscle pain. SKIN: Negative for lesions, rash PSYCH: Negative for sleep disturbance, mood disorder and recent psychosocial stressors. HEMATOLOGY Negative for prolonged bleeding, bruising easily, and swollen nodes. ENDOCRINE: Negative for cold or heat intolerance, polyuria, polydipsia and goiter. NEURO: negative for tremor, gait imbalance, syncope and seizures. The remainder of the review of systems is noncontributory.   Physical Exam: BP (!) 147/92 (BP Location: Left Arm, Patient Position: Sitting, Cuff Size: Large)   Pulse 87   Temp 98.5 F (36.9 C) (Oral)   Ht 5\' 2"  (1.575 m)   Wt 148 lb (67.1 kg)   BMI 27.07 kg/m  GENERAL: The patient is AO x3, in no  acute distress. HEENT: Head is normocephalic and atraumatic. EOMI are intact. Mouth is well hydrated and without lesions. NECK: Supple. No masses LUNGS: Clear to auscultation. No presence of rhonchi/wheezing/rales. Adequate chest expansion HEART: RRR, normal s1 and s2. ABDOMEN: Soft, nontender, no guarding, no peritoneal signs, and nondistended. BS +. No masses. EXTREMITIES: Without any cyanosis, clubbing, rash, lesions or edema. NEUROLOGIC: AOx3, no focal motor deficit. SKIN: no jaundice, no rashes  Imaging/Labs: as above  I personally reviewed and interpreted the available labs, imaging and endoscopic files.  Impression and Plan: NADYNE GARIEPY is a 70 y.o. female with past medical history of GERD, hyperlipidemia, depression, who presents for follow up of GERD and bloating.  Regarding her GERD, the patient has presented significant improvement of her symptoms while taking pantoprazole 40 mg every day.  She is taking the medication adequately.  Given that she is presenting breakthrough episodes rarely, she will benefit from implementing the use of Pepcid during  the night as needed.  She does not have any other red flag signs and does not warrant any repeat endoscopic investigations.  I explained to her that her bloating episodes could be related to her intake of fiber.  I advised her to either switch to a different fiber product such as Benefiber or she can attempt to increase her MiraLAX.  Either way she will need to stop the Metamucil as this may be leading to her recurrent bloating.  - Increase Miralax to 2 capfuls every day or can try Benefiber once a day - Stop Metamucil - Continue pantoprazole 40 mg every day - If recurrent episodes of heartburn, can use Pepcid over-the-counter for breakthrough episodes  All questions were answered.      Harvel Quale, MD Gastroenterology and Hepatology [facility]

## 2020-12-27 NOTE — Patient Instructions (Signed)
Increase Miralax to 2 capfuls every day or can try Benefiber once a day Stop Metamucil Continue pantoprazole 40 mg every day If recurrent episodes of heartburn, can use Pepcid over-the-counter for breakthrough episodes

## 2021-03-29 ENCOUNTER — Encounter: Payer: Self-pay | Admitting: Adult Health

## 2021-03-29 ENCOUNTER — Other Ambulatory Visit: Payer: Self-pay

## 2021-03-29 ENCOUNTER — Ambulatory Visit (INDEPENDENT_AMBULATORY_CARE_PROVIDER_SITE_OTHER): Payer: Medicare Other | Admitting: Adult Health

## 2021-03-29 VITALS — BP 155/91 | HR 86 | Ht 62.0 in | Wt 150.0 lb

## 2021-03-29 DIAGNOSIS — Z90710 Acquired absence of both cervix and uterus: Secondary | ICD-10-CM | POA: Diagnosis not present

## 2021-03-29 DIAGNOSIS — N816 Rectocele: Secondary | ICD-10-CM | POA: Diagnosis not present

## 2021-03-29 DIAGNOSIS — D280 Benign neoplasm of vulva: Secondary | ICD-10-CM | POA: Diagnosis not present

## 2021-03-29 DIAGNOSIS — Z1211 Encounter for screening for malignant neoplasm of colon: Secondary | ICD-10-CM | POA: Diagnosis not present

## 2021-03-29 LAB — HEMOCCULT GUIAC POC 1CARD (OFFICE): Fecal Occult Blood, POC: NEGATIVE

## 2021-03-29 NOTE — Progress Notes (Signed)
°  Subjective:     Patient ID: Janice Stephens, female   DOB: [birth year], 71 y.o.   MRN: [id number]  HPI Janice Stephens is a 71 year old white female, widowed with SO, sp hysterectomy in complaining of "something does not feel right down there". Seems to be in left labia area. She is active, works in yard. She is new pt. PCP is Dr Gerarda Fraction.  Review of Systems "Something does not feel right down there, esp left labia Has IBS-C, takes metamucil and miralax Does not sleep well, takes xanax Partner has ED  Reviewed past medical,surgical, social and family history. Reviewed medications and allergies.     Objective:   Physical Exam BP (!) 155/91 (BP Location: Right Arm, Patient Position: Sitting, Cuff Size: Normal)    Pulse 86    Ht 5\' 2"  (1.575 m)    Wt 150 lb (68 kg)    BMI 27.44 kg/m     Skin warm and dry.Pelvic: external genitalia is normal in appearance, has several angiokeratomas, more on left. vagina: pale, with loss of rugae, masses or lesions,urethra has no lesions or masses noted, cervix and uterus are absent, adnexa: no masses or tenderness noted. Bladder is non tender and no masses felt.On rectal exam has good tone, +low rectocele, hemoccult was negative.  AA is 3 Fall risk is low Depression screen PHQ 2/9 03/29/2021  Decreased Interest 0  Down, Depressed, Hopeless 0  PHQ - 2 Score 0  Altered sleeping 3  Tired, decreased energy 0  Change in appetite 0  Feeling bad or failure about yourself  0  Trouble concentrating 1  Moving slowly or fidgety/restless 0  Suicidal thoughts 0  PHQ-9 Score 4    GAD 7 : Generalized Anxiety Score 03/29/2021  Nervous, Anxious, on Edge 3  Control/stop worrying 3  Worry too much - different things 3  Trouble relaxing 3  Restless 2  Easily annoyed or irritable 2  Afraid - awful might happen 1  Total GAD 7 Score 17  She has xanax prn  Upstream - 03/29/21 0944       Pregnancy Intention Screening   Does the patient want to become pregnant in the next year?  No    Does the patient's partner want to become pregnant in the next year? No    Would the patient like to discuss contraceptive options today? No      Contraception Wrap Up   Current Method --   hyst   End Method --   hyst   Contraception Counseling Provided No              Examination chaperoned by Celene Squibb LPN  Assessment:     1. Angiokeratoma of vulva Showed pictures in [facility] Just leave alone  They can bleed  2. Rectocele  3. S/P hysterectomy  4. Encounter for screening fecal occult blood testing     Plan:    Physical with PCP Mammogram every 1-2 years Colonoscopy per GI Labs with PCP Follow up prn

## 2021-04-29 ENCOUNTER — Other Ambulatory Visit (INDEPENDENT_AMBULATORY_CARE_PROVIDER_SITE_OTHER): Payer: Self-pay | Admitting: Internal Medicine

## 2021-08-24 ENCOUNTER — Other Ambulatory Visit: Payer: Self-pay | Admitting: Internal Medicine

## 2021-08-24 DIAGNOSIS — Z1231 Encounter for screening mammogram for malignant neoplasm of breast: Secondary | ICD-10-CM

## 2021-09-22 ENCOUNTER — Other Ambulatory Visit (INDEPENDENT_AMBULATORY_CARE_PROVIDER_SITE_OTHER): Payer: Self-pay | Admitting: Gastroenterology

## 2021-09-28 ENCOUNTER — Ambulatory Visit
Admission: RE | Admit: 2021-09-28 | Discharge: 2021-09-28 | Disposition: A | Discharge status: Home / Self Care | Payer: Medicare Other | Source: Ambulatory Visit | Attending: Internal Medicine | Admitting: Internal Medicine

## 2021-09-28 DIAGNOSIS — Z1231 Encounter for screening mammogram for malignant neoplasm of breast: Secondary | ICD-10-CM

## 2021-12-20 ENCOUNTER — Other Ambulatory Visit (INDEPENDENT_AMBULATORY_CARE_PROVIDER_SITE_OTHER): Payer: Self-pay | Admitting: Internal Medicine

## 2022-01-01 ENCOUNTER — Encounter (INDEPENDENT_AMBULATORY_CARE_PROVIDER_SITE_OTHER): Payer: Self-pay | Admitting: Gastroenterology

## 2022-01-02 ENCOUNTER — Ambulatory Visit (INDEPENDENT_AMBULATORY_CARE_PROVIDER_SITE_OTHER): Payer: Medicare Other | Admitting: Gastroenterology

## 2022-01-16 ENCOUNTER — Telehealth: Payer: Self-pay

## 2022-01-16 ENCOUNTER — Other Ambulatory Visit (INDEPENDENT_AMBULATORY_CARE_PROVIDER_SITE_OTHER): Payer: Self-pay | Admitting: Gastroenterology

## 2022-01-16 DIAGNOSIS — K219 Gastro-esophageal reflux disease without esophagitis: Secondary | ICD-10-CM

## 2022-01-16 MED ORDER — PANTOPRAZOLE SODIUM 40 MG PO TBEC: 40.0000 mg | DELAYED_RELEASE_TABLET | Freq: Every day | ORAL | 0 refills | Status: DC

## 2022-01-16 NOTE — Telephone Encounter (Signed)
Will refill medication for 3 months, needs follow up appointment with any provider in order to receive any refills.  Thanks,  Arnetta Odeh Castaneda, MD Gastroenterology and Hepatology  [facility]  

## 2022-01-16 NOTE — Telephone Encounter (Signed)
Refill request for pantoprazole sod dr 40 mg tab qty: 30 received from [place]. Pt was last seen on 12/27/2020.

## 2022-03-20 ENCOUNTER — Other Ambulatory Visit (INDEPENDENT_AMBULATORY_CARE_PROVIDER_SITE_OTHER): Payer: Self-pay | Admitting: Gastroenterology

## 2022-03-20 ENCOUNTER — Encounter (INDEPENDENT_AMBULATORY_CARE_PROVIDER_SITE_OTHER): Payer: Self-pay | Admitting: Gastroenterology

## 2022-03-20 ENCOUNTER — Ambulatory Visit (INDEPENDENT_AMBULATORY_CARE_PROVIDER_SITE_OTHER): Payer: Medicare Other | Admitting: Gastroenterology

## 2022-03-20 VITALS — BP 143/98 | HR 92 | Temp 97.8°F | Ht 62.0 in | Wt 150.4 lb

## 2022-03-20 DIAGNOSIS — K581 Irritable bowel syndrome with constipation: Secondary | ICD-10-CM

## 2022-03-20 DIAGNOSIS — R140 Abdominal distension (gaseous): Secondary | ICD-10-CM

## 2022-03-20 DIAGNOSIS — K219 Gastro-esophageal reflux disease without esophagitis: Secondary | ICD-10-CM | POA: Diagnosis not present

## 2022-03-20 NOTE — Progress Notes (Signed)
Maylon Peppers, M.D. Gastroenterology & Hepatology [facility] [address] [place], [place] [address]  Primary Care Physician: [address], [place] [address]  I will communicate my assessment and recommendations to the referring MD via EMR.  Problems: GERD Bloating  History of Present Illness: SARAJANE FAMBROUGH is a 72 y.o. female with past medical history of GERD, hyperlipidemia, depression  who presents for follow up of GERD and bloating.  The patient was last seen on 12/27/2020. At that time, the patient patient was advised to increase MiraLAX to 2 capfuls every day or to switch to Benefiber instead of taking Metamucil.  She was continued pantoprazole 40 mg every day.  Patient reports that she is having occasional episodes of heartburn, possibly once or twice a month. She is taking pantoprazole 40 mg first thing in the morning compliantly. When she has heartburn, she may take Pepcid PRN which improves her symptoms.  No odynophagia or dysphagia.  She reports that she has still some bloating, for which she takes Gas-X PRN which improves her symptoms. She has not identified a specific type of food that triggers this. She uses lactose free milk.  The patient denies having any nausea, vomiting, fever, chills, hematochezia, melena, hematemesis, abdominal distention, abdominal pain, diarrhea, jaundice, pruritus or weight loss.  Last EGD: 10/04/2018, irregular Z-line at 35 cm, 2 cm hiatal hernia, otherwise normal Last Colonoscopy: 10/04/2018, diverticulosis, external/internal hemorrhoids.  Recommended repeat colonoscopy in 7 years  Past Medical History: Past Medical History:  Diagnosis Date   GERD (gastroesophageal reflux disease)    Hyperlipidemia    Insomnia    Major depressive disorder     Past Surgical History: Past Surgical History:  Procedure Laterality Date   ABDOMINAL HYSTERECTOMY     APPENDECTOMY      CHOLECYSTECTOMY     COLONOSCOPY  2012   COLONOSCOPY WITH PROPOFOL N/A 10/04/2018   Procedure: COLONOSCOPY WITH PROPOFOL;  Surgeon: Rogene Houston, MD;  [facility];  Service: Endoscopy;  Laterality: N/A;   ESOPHAGOGASTRODUODENOSCOPY (EGD) WITH PROPOFOL N/A 10/04/2018   Procedure: ESOPHAGOGASTRODUODENOSCOPY (EGD) WITH PROPOFOL;  Surgeon: Rogene Houston, MD;  Location: [facility];  Service: Endoscopy;  Laterality: N/A;  9:30   ORTHOPEDIC SURGERY     right and left knee   TENDON REPAIR Right    Elbow   TONSILLECTOMY     UPPER GASTROINTESTINAL ENDOSCOPY      Family History: Family History  Problem Relation Age of Onset   Heart attack Paternal Grandfather    Colon cancer Maternal Grandfather    Diabetes Father    Heart disease Father    Heart attack Father    Melanoma Father    Cancer Mother    Heart disease Mother    AAA (abdominal aortic aneurysm) Mother    Breast cancer Neg Hx     Social History: Social History   Tobacco Use  Smoking Status Former   Types: Cigarettes  Smokeless Tobacco Never   Social History   Substance and Sexual Activity  Alcohol Use Yes   Comment: occasionally   Social History   Substance and Sexual Activity  Drug Use No    Allergies: Allergies  Allergen Reactions   Doxycycline     Dizziness    Sulfonamide Derivatives Nausea And Vomiting   Zolpidem Tartrate     Sleep walking    Medications: Current Outpatient Medications  Medication Sig Dispense Refill   acetaminophen (TYLENOL)  500 MG tablet Take 500 mg by mouth daily as needed for moderate pain or headache.     acyclovir (ZOVIRAX) 400 MG tablet Take 400 mg by mouth daily.     ALPRAZolam (XANAX) 0.5 MG tablet Take 0.5 mg by mouth 3 (three) times daily as needed for anxiety or sleep.      famotidine (PEPCID) 20 MG tablet Take 1 tablet (20 mg total) by mouth at bedtime as needed for heartburn or indigestion. (Patient taking differently: Take 10 mg by mouth at  bedtime as needed for heartburn or indigestion.)     melatonin 3 MG TABS tablet Take 3 mg by mouth at bedtime.     meloxicam (MOBIC) 7.5 MG tablet Take 7.5 mg by mouth daily with supper.      mirtazapine (REMERON SOL-TAB) 30 MG disintegrating tablet Take 45 mg by mouth at bedtime.  11   OVER THE COUNTER MEDICATION Calcium 333 mg daily. Magnesium 133 mg daily Zinc 8 mg daily Tumeric 1000 mg once per day B complex once per day. Oxymetazole  0.05 % one spray in each nare Qhs Systane two drops in each eye daily     pantoprazole (PROTONIX) 40 MG tablet Take 1 tablet (40 mg total) by mouth daily. 90 tablet 0   Polyethylene Glycol 3350 (MIRALAX PO) Take by mouth. Patient states that she uses 2 teaspoons daily.     pravastatin (PRAVACHOL) 20 MG tablet Take 20 mg by mouth every evening.      psyllium (METAMUCIL) 58.6 % powder Take 1 packet by mouth daily.     No current facility-administered medications for this visit.    Review of Systems: GENERAL: negative for malaise, night sweats HEENT: No changes in hearing or vision, no nose bleeds or other nasal problems. NECK: Negative for lumps, goiter, pain and significant neck swelling RESPIRATORY: Negative for cough, wheezing CARDIOVASCULAR: Negative for chest pain, leg swelling, palpitations, orthopnea GI: SEE HPI MUSCULOSKELETAL: Negative for joint pain or swelling, back pain, and muscle pain. SKIN: Negative for lesions, rash PSYCH: Negative for sleep disturbance, mood disorder and recent psychosocial stressors. HEMATOLOGY Negative for prolonged bleeding, bruising easily, and swollen nodes. ENDOCRINE: Negative for cold or heat intolerance, polyuria, polydipsia and goiter. NEURO: negative for tremor, gait imbalance, syncope and seizures. The remainder of the review of systems is noncontributory.   Physical Exam: BP (!) 143/98 (BP Location: Left Arm, Patient Position: Sitting, Cuff Size: Small)   Pulse 92   Temp 97.8 F (36.6 C) (Temporal)    Ht '5\' 2"'$  (1.575 m)   Wt 150 lb 6.4 oz (68.2 kg)   BMI 27.51 kg/m  GENERAL: The patient is AO x3, in no acute distress. HEENT: Head is normocephalic and atraumatic. EOMI are intact. Mouth is well hydrated and without lesions. NECK: Supple. No masses LUNGS: Clear to auscultation. No presence of rhonchi/wheezing/rales. Adequate chest expansion HEART: RRR, normal s1 and s2. ABDOMEN: Soft, nontender, no guarding, no peritoneal signs, and nondistended. BS +. No masses. EXTREMITIES: Without any cyanosis, clubbing, rash, lesions or edema. NEUROLOGIC: AOx3, no focal motor deficit. SKIN: no jaundice, no rashes  Imaging/Labs: as above  I personally reviewed and interpreted the available labs, imaging and endoscopic files.  Impression and Plan: ATHIRA JANOWICZ is a 72 y.o. female with past medical history of GERD, hyperlipidemia, depression  who presents for follow up of GERD and bloating.  Regarding her GERD, this has been relatively well-controlled with the current doses of pantoprazole 40 mg every day.  Has rarely needed to use breakthrough doses of Pepcid which she can continue using as needed for now. The patient and I held a thorough discussion about potential nonpharmacologic treatments for reflux which include Transoral Incisionless Fundoplication (TIF) .  The benefits and risks, as well as prognosis with the use of the different modalities was thoroughly discussed with the patient who understood and agreed.  The patient will read more about this procedure and will let me know if interested to pursue this in the future.  Regarding her chronic bloating, it is possible that this is related to some bowel hypersensitivity.  As she has had relief of her symptoms with the use of Gas-X, she can keep taking this as needed for now.  Will check celiac serologies today.  The patient was found to have elevated blood pressure when vital signs were checked in the office. The blood pressure was rechecked by  the nursing staff and it was found be persistently elevated >140/90 mmHg. I personally advised to the patient to follow up closely with PCP for hypertension control.  -Check celiac disease panel -Continue pantoprazole 40 mg qday -Can take Pepcid as needed for breakthrough heartburn -Can take Gas-X as needed for bloating.  All questions were answered.      Maylon Peppers, MD Gastroenterology and Hepatology [facility]

## 2022-03-20 NOTE — Patient Instructions (Addendum)
Perform blood workup Continue pantoprazole 40 mg qday Can take Pepcid as needed for breakthrough heartburn The patient and I held a thorough discussion about potential nonpharmacologic treatments for reflux which include Transoral Incisionless Fundoplication (TIF) .  The benefits and risks, as well as prognosis with the use of the different modalities was thoroughly discussed with the patient who understood and agreed.  The patient will read more about this procedure and will let me know if interested to pursue this in the future. Can take Gas-X as needed for bloating. The patient was found to have elevated blood pressure when vital signs were checked in the office. The blood pressure was rechecked by the nursing staff and it was found be persistently elevated >140/90 mmHg. I personally advised to the patient to follow up closely with PCP for hypertension control.

## 2022-03-21 LAB — CELIAC DISEASE PANEL
(tTG) Ab, IgA: <1 U/mL
(tTG) Ab, IgG: <1 U/mL
Gliadin IgA: <1 U/mL
Gliadin IgG: <1 U/mL
Immunoglobulin A: 70 mg/dL (ref 70–320)

## 2022-04-21 NOTE — H&P (Signed)
Surgical History & Physical  Patient Name: Janice Stephens  DOB: Apr 08, 1950  Surgery: Cataract extraction with intraocular lens implant phacoemulsification; Left Eye Surgeon: Ronn Melena MD Surgery Date: 04/28/2022 Pre-Op Date: 03/22/2022  HPI: A 66 Yr. old female patient present for cataract eval per Dr. Jorja Loa. 1. 1. The patient complains of difficulty when driving due to glare from headlights or sun, which began 6 months ago. Both eyes are affected. The episode is constant. The condition's severity is worsening. This is negatively affecting the patient's quality of life and the patient is unable to function adequately in life with the current level of vision.  Medical History: Cataracts LDL depression, migraines, restless leg syndrome  Review of Systems Negative Allergic/Immunologic Negative Cardiovascular Negative Constitutional Negative Ear, Nose, Mouth & Throat Negative Endocrine Negative Eyes Negative Gastrointestinal Negative Genitourinary Negative Hemotologic/Lymphatic Negative Integumentary Negative Musculoskeletal Migraines Neurological Depression Psychiatry Negative Respiratory  Social Former smoker of Cigarettes   Medication Systance,  Mirtazapine, Pravastatin, Xanax  Sx/Procedures Hysterectomy, Appendectomy, Tonsillectomy, Knee sx, bilateral, Elbow sx, Gallbladder Sx   Drug Allergies  Doxyclicline, Sulfa   History & Physical: Heent: Cataracts NECK: supple without bruits LUNGS: lungs clear to auscultation CV: regular rate and rhythm Abdomen: soft and non-tender  Impression & Plan:  Assessment: 1.  Myopia ; Both Eyes (H52.13) 2.  CATARACT AGE-RELATED COMBINED FORMS; Both Eyes (H25.813)  Plan: 1.  Hold on new glasses for now  2.  Cataracts are visually significant and account for the patient's complaints. Discussed all risks, benefits, procedures and recovery, including infection, loss of vision and eye, need for glasses after surgery or  additional procedures. Patient understands changing glasses will not improve vision. Patient indicated understanding of procedure. All questions answered. Patient desires to have surgery, recommend phacoemulsification with intraocular lens. Patient to have preliminary testing necessary (Argos/IOL Master, Mac OCT, TOPO) Educational materials provided.  Plan: - Proceed with surgery OS followed by OD - RayOne lens, target -0.25 - ok with lying flat - no fuchs, no DM - Omidria, ok for combo drop

## 2022-04-24 ENCOUNTER — Encounter (HOSPITAL_COMMUNITY): Payer: Self-pay

## 2022-04-24 ENCOUNTER — Encounter (HOSPITAL_COMMUNITY)
Admission: RE | Admit: 2022-04-24 | Discharge: 2022-04-24 | Disposition: A | Discharge status: Home / Self Care | Payer: Medicare Other | Source: Ambulatory Visit | Attending: Optometry | Admitting: Optometry

## 2022-04-24 ENCOUNTER — Other Ambulatory Visit: Payer: Self-pay

## 2022-04-24 HISTORY — DX: Essential (primary) hypertension: I10

## 2022-04-28 ENCOUNTER — Ambulatory Visit (HOSPITAL_COMMUNITY): Payer: Medicare Other | Admitting: Certified Registered"

## 2022-04-28 ENCOUNTER — Encounter (HOSPITAL_COMMUNITY): Payer: Self-pay | Admitting: Optometry

## 2022-04-28 ENCOUNTER — Ambulatory Visit (HOSPITAL_BASED_OUTPATIENT_CLINIC_OR_DEPARTMENT_OTHER): Payer: Medicare Other | Admitting: Certified Registered"

## 2022-04-28 ENCOUNTER — Ambulatory Visit (HOSPITAL_COMMUNITY)
Admission: RE | Admit: 2022-04-28 | Discharge: 2022-04-28 | Disposition: A | Discharge status: Home / Self Care | Payer: Medicare Other | Attending: Optometry | Admitting: Optometry

## 2022-04-28 ENCOUNTER — Encounter (HOSPITAL_COMMUNITY)
Admission: RE | Disposition: A | Discharge status: Home / Self Care | Payer: Self-pay | Source: Home / Self Care | Attending: Optometry

## 2022-04-28 DIAGNOSIS — H5213 Myopia, bilateral: Secondary | ICD-10-CM | POA: Insufficient documentation

## 2022-04-28 DIAGNOSIS — H25812 Combined forms of age-related cataract, left eye: Secondary | ICD-10-CM

## 2022-04-28 DIAGNOSIS — I10 Essential (primary) hypertension: Secondary | ICD-10-CM

## 2022-04-28 DIAGNOSIS — F419 Anxiety disorder, unspecified: Secondary | ICD-10-CM | POA: Insufficient documentation

## 2022-04-28 DIAGNOSIS — Z87891 Personal history of nicotine dependence: Secondary | ICD-10-CM | POA: Diagnosis not present

## 2022-04-28 DIAGNOSIS — F418 Other specified anxiety disorders: Secondary | ICD-10-CM

## 2022-04-28 DIAGNOSIS — H2512 Age-related nuclear cataract, left eye: Secondary | ICD-10-CM | POA: Diagnosis present

## 2022-04-28 DIAGNOSIS — F32A Depression, unspecified: Secondary | ICD-10-CM | POA: Diagnosis not present

## 2022-04-28 HISTORY — PX: CATARACT EXTRACTION W/PHACO: SHX586

## 2022-04-28 SURGERY — PHACOEMULSIFICATION, CATARACT, WITH IOL INSERTION
Anesthesia: Monitor Anesthesia Care | Site: Eye | Laterality: Left

## 2022-04-28 MED ORDER — TETRACAINE HCL 0.5 % OP SOLN
1.0000 [drp] | OPHTHALMIC | Status: AC | PRN
  Administered 2022-04-28 (×3): 1 [drp] via OPHTHALMIC

## 2022-04-28 MED ORDER — SIGHTPATH DOSE#1 NA HYALUR & NA CHOND-NA HYALUR IO KIT
PACK | INTRAOCULAR | Status: DC | PRN
  Administered 2022-04-28: 1 via OPHTHALMIC

## 2022-04-28 MED ORDER — PHENYLEPHRINE-KETOROLAC 1-0.3 % IO SOLN
INTRAOCULAR | Status: DC | PRN
  Administered 2022-04-28: 500 mL via OPHTHALMIC

## 2022-04-28 MED ORDER — STERILE WATER FOR IRRIGATION IR SOLN
Status: DC | PRN
  Administered 2022-04-28: 250 mL

## 2022-04-28 MED ORDER — LIDOCAINE HCL (PF) 1 % IJ SOLN
INTRAMUSCULAR | Status: DC | PRN
  Administered 2022-04-28: 1 mL

## 2022-04-28 MED ORDER — TROPICAMIDE 1 % OP SOLN
1.0000 [drp] | OPHTHALMIC | Status: AC | PRN
  Administered 2022-04-28 (×3): 1 [drp] via OPHTHALMIC

## 2022-04-28 MED ORDER — POVIDONE-IODINE 5 % OP SOLN
OPHTHALMIC | Status: DC | PRN
  Administered 2022-04-28: 1 via OPHTHALMIC

## 2022-04-28 MED ORDER — LIDOCAINE HCL 3.5 % OP GEL
1.0000 | Freq: Once | OPHTHALMIC | Status: AC
  Administered 2022-04-28: 1 via OPHTHALMIC

## 2022-04-28 MED ORDER — MIDAZOLAM HCL 2 MG/2ML IJ SOLN
INTRAMUSCULAR | Status: AC
  Filled 2022-04-28: qty 2

## 2022-04-28 MED ORDER — BSS IO SOLN
INTRAOCULAR | Status: DC | PRN
  Administered 2022-04-28: 15 mL via INTRAOCULAR

## 2022-04-28 MED ORDER — SODIUM CHLORIDE 0.9% FLUSH
INTRAVENOUS | Status: DC | PRN
  Administered 2022-04-28: 5 mL via INTRAVENOUS

## 2022-04-28 MED ORDER — PHENYLEPHRINE HCL 2.5 % OP SOLN
1.0000 [drp] | OPHTHALMIC | Status: AC | PRN
  Administered 2022-04-28 (×3): 1 [drp] via OPHTHALMIC

## 2022-04-28 MED ORDER — MIDAZOLAM HCL 2 MG/2ML IJ SOLN
INTRAMUSCULAR | Status: DC | PRN
  Administered 2022-04-28: 2 mg via INTRAVENOUS

## 2022-04-28 MED ORDER — FENTANYL CITRATE (PF) 100 MCG/2ML IJ SOLN
INTRAMUSCULAR | Status: AC
  Filled 2022-04-28: qty 2

## 2022-04-28 MED ORDER — NEOMYCIN-POLYMYXIN-DEXAMETH 3.5-10000-0.1 OP SUSP: OPHTHALMIC | Status: DC | PRN

## 2022-04-28 SURGICAL SUPPLY — 16 items
CATARACT SUITE SIGHTPATH (MISCELLANEOUS) ×1 IMPLANT
CLOTH BEACON ORANGE TIMEOUT ST (SAFETY) ×1 IMPLANT
DRSG TEGADERM 4X4.75 (GAUZE/BANDAGES/DRESSINGS) ×1 IMPLANT
EYE SHIELD UNIVERSAL CLEAR (GAUZE/BANDAGES/DRESSINGS) IMPLANT
FEE CATARACT SUITE SIGHTPATH (MISCELLANEOUS) ×1 IMPLANT
GLOVE BIOGEL PI IND STRL 7.0 (GLOVE) ×2 IMPLANT
LENS IOL RAYNER 15.5 (Intraocular Lens) ×1 IMPLANT
LENS IOL RAYONE EMV 15.5 (Intraocular Lens) IMPLANT
NDL HYPO 18GX1.5 BLUNT FILL (NEEDLE) ×1 IMPLANT
NEEDLE HYPO 18GX1.5 BLUNT FILL (NEEDLE) ×1 IMPLANT
PAD ARMBOARD 7.5X6 YLW CONV (MISCELLANEOUS) ×1 IMPLANT
RING MALYGIN 7.0 (MISCELLANEOUS) IMPLANT
SYR TB 1ML LL NO SAFETY (SYRINGE) ×1 IMPLANT
TAPE SURG TRANSPORE 1 IN (GAUZE/BANDAGES/DRESSINGS) IMPLANT
TAPE SURGICAL TRANSPORE 1 IN (GAUZE/BANDAGES/DRESSINGS) ×1
WATER STERILE IRR 250ML POUR (IV SOLUTION) ×1 IMPLANT

## 2022-04-28 NOTE — Op Note (Signed)
Date of procedure: 04/28/22  Pre-operative diagnosis: Visually significant age-related nuclear cataract, Left Eye (H25.12)  Post-operative diagnosis: Visually significant age-related nuclear cataract, Left Eye  Procedure: Removal of cataract via phacoemulsification and insertion of intra-ocular lens Rayner [id number] +15.5D into the capsular bag of the Left Eye  Attending surgeon: Bryon Lions, MD  Anesthesia: MAC, Topical Akten  Complications: None  Estimated Blood Loss: <77m (minimal)  Specimens: None  Implants:  Implant Name Type Inv. Item Serial No. Manufacturer Lot No. LRB No. Used Action  LENS IOL RAYNER 15.5 - S69 Intraocular Lens LENS IOL RAYNER 15.5 69 SIGHTPATH [id number] Implanted    Indications:  Visually significant age-related cataract, Left Eye  Procedure:  The patient was seen and identified in the pre-operative area. The operative eye was identified and dilated.  The operative eye was marked.  Topical anesthesia was administered to the operative eye.     The patient was then to the operative suite and placed in the supine position.  A timeout was performed confirming the patient, procedure to be performed, and all other relevant information.   The patient's face was prepped and draped in the usual fashion for intra-ocular surgery.  A lid speculum was placed into the operative eye and the surgical microscope moved into place and focused.  An inferotemporal paracentesis was created using a 20 gauge paracentesis blade.  BSS mixed with Omidria, followed by 1% lidocaine was injected into the anterior chamber.  Viscoelastic was injected into the anterior chamber.  A temporal clear-corneal main wound incision was created using a 2.468mmicrokeratome.  A continuous curvilinear capsulorrhexis was initiated using an irrigating cystitome and completed using capsulorrhexis forceps.  Hydrodissection and hydrodeliniation were performed.  Viscoelastic was injected into the  anterior chamber.  A phacoemulsification handpiece and a chopper as a second instrument were used to remove the nucleus and epinucleus. The irrigation/aspiration handpiece was used to remove any remaining cortical material.   The capsular bag was reinflated with viscoelastic, checked, and found to be intact.  The intraocular lens was inserted into the capsular bag.  The irrigation/aspiration handpiece was used to remove any remaining viscoelastic.  The clear corneal wound and paracentesis wounds were then hydrated and checked with Weck-Cels to be watertight.  The lid-speculum and drape was removed, and the patient's face was cleaned with a wet and dry 4x4.  Moxifloxacin was instilled onto the eye. A clear shield was taped over the eye. The patient was taken to the post-operative care unit in good condition, having tolerated the procedure well.  Post-Op Instructions: [facility] and will receive all other orders and instructions.

## 2022-04-28 NOTE — Anesthesia Procedure Notes (Signed)
Procedure Name: MAC Date/Time: 04/28/2022 11:05 AM  Performed by: Orlie Dakin, CRNAPre-anesthesia Checklist: Patient identified, Emergency Drugs available, Suction available and Patient being monitored Patient Re-evaluated:Patient Re-evaluated prior to induction Oxygen Delivery Method: Nasal cannula Placement Confirmation: positive ETCO2

## 2022-04-28 NOTE — Interval H&P Note (Signed)
History and Physical Interval Note:  04/28/2022 10:32 AM  The H and P was reviewed and updated. The patient was examined.  No changes were found after exam.  The surgical eye was marked.   Janice Stephens

## 2022-04-28 NOTE — Transfer of Care (Signed)
Immediate Anesthesia Transfer of Care Note  Patient: Janice Stephens  Procedure(s) Performed: CATARACT EXTRACTION PHACO AND INTRAOCULAR LENS PLACEMENT (IOC) (Left: Eye)  Patient Location: Short Stay  Anesthesia Type:MAC  Level of Consciousness: awake, alert , and oriented  Airway & Oxygen Therapy: Patient Spontanous Breathing  Post-op Assessment: Report given to RN and Post -op Vital signs reviewed and stable  Post vital signs: Reviewed and stable  Last Vitals:  Vitals Value Taken Time  BP    Temp    Pulse    Resp    SpO2      Last Pain:  Vitals:   04/28/22 1028  TempSrc: Oral         Complications: No notable events documented.

## 2022-04-28 NOTE — Anesthesia Preprocedure Evaluation (Signed)
Anesthesia Evaluation  Patient identified by MRN, date of birth, ID band Patient awake    Reviewed: Allergy & Precautions, H&P , NPO status , Patient's Chart, lab work & pertinent test results, reviewed documented beta blocker date and time   Airway Mallampati: II  TM Distance: >3 FB Neck ROM: full    Dental no notable dental hx.    Pulmonary neg pulmonary ROS, former smoker   Pulmonary exam normal breath sounds clear to auscultation       Cardiovascular Exercise Tolerance: Good hypertension, negative cardio ROS  Rhythm:regular Rate:Normal     Neuro/Psych  PSYCHIATRIC DISORDERS Anxiety Depression    negative neurological ROS  negative psych ROS   GI/Hepatic negative GI ROS, Neg liver ROS,GERD  ,,  Endo/Other  negative endocrine ROS    Renal/GU negative Renal ROS  negative genitourinary   Musculoskeletal   Abdominal   Peds  Hematology negative hematology ROS (+)   Anesthesia Other Findings   Reproductive/Obstetrics negative OB ROS                             Anesthesia Physical Anesthesia Plan  ASA: 2  Anesthesia Plan: MAC   Post-op Pain Management:    Induction:   PONV Risk Score and Plan:   Airway Management Planned:   Additional Equipment:   Intra-op Plan:   Post-operative Plan:   Informed Consent: I have reviewed the patients History and Physical, chart, labs and discussed the procedure including the risks, benefits and alternatives for the proposed anesthesia with the patient or authorized representative who has indicated his/her understanding and acceptance.     Dental Advisory Given  Plan Discussed with: CRNA  Anesthesia Plan Comments:        Anesthesia Quick Evaluation

## 2022-04-28 NOTE — Discharge Instructions (Signed)
Please discharge patient when stable, will follow up today with Dr. Taniela Feltus at the [facility] office immediately following discharge.  Leave shield in place until visit.  All paperwork with discharge instructions will be given at the office.  [facility] Address:  [address]  [place], [place] [address]  Dr. Tonica Brasington's Phone: [phone]  

## 2022-05-01 NOTE — Anesthesia Postprocedure Evaluation (Signed)
Anesthesia Post Note  Patient: Janice Stephens  Procedure(s) Performed: CATARACT EXTRACTION PHACO AND INTRAOCULAR LENS PLACEMENT (IOC) (Left: Eye)  Patient location during evaluation: Phase II Anesthesia Type: MAC Level of consciousness: awake Pain management: pain level controlled Vital Signs Assessment: post-procedure vital signs reviewed and stable Respiratory status: spontaneous breathing and respiratory function stable Cardiovascular status: blood pressure returned to baseline and stable Postop Assessment: no headache and no apparent nausea or vomiting Anesthetic complications: no Comments: Late entry   No notable events documented.   Last Vitals:  Vitals:   04/28/22 1028 04/28/22 1126  BP: (!) 129/90 (!) 137/92  Pulse: 89 78  Resp: (!) 25 14  Temp: 36.4 C   SpO2: 98% 98%    Last Pain:  Vitals:   04/28/22 1126  TempSrc:   PainSc: 0-No pain                 Louann Sjogren

## 2022-05-02 NOTE — H&P (Signed)
Surgical History & Physical  Patient Name: Janice Stephens  DOB: [birth year]  Surgery: Cataract extraction with intraocular lens implant phacoemulsification; Right Eye Surgeon: Ronn Melena MD Surgery Date: 05/12/2022 Pre-Op Date: 03/22/2022  HPI: A 54 Yr. old female patient present for cataract eval per Dr. Jorja Loa. 1. 1. The patient complains of difficulty when driving due to glare from headlights or sun, which began 6 months ago. Both eyes are affected. The episode is constant. The condition's severity is worsening. This is negatively affecting the patient's quality of life and the patient is unable to function adequately in life with the current level of vision.  Medical History: Cataracts LDL depression, migraines, restless leg syndrome  Review of Systems Negative Allergic/Immunologic Negative Cardiovascular Negative Constitutional Negative Ear, Nose, Mouth & Throat Negative Endocrine Negative Eyes Negative Gastrointestinal Negative Genitourinary Negative Hemotologic/Lymphatic Negative Integumentary Negative Musculoskeletal Neurological: Migraines Psychiatry: Depression Negative Respiratory  Social Former smoker of Cigarettes   Medication Systance, Prednisolone acetate 1%, Ciprofloxacin,  Mirtazapine, Pravastatin, Xanax   Sx/Procedures Phaco c IOL OS,  Hysterectomy, Appendectomy, Tonsillectomy, Knee sx, bilateral, Elbow sx, Gallbladder Sx  Drug Allergies  Doxyclicline, Sulfa   History & Physical: Heent: cataract NECK: supple without bruits LUNGS: lungs clear to auscultation CV: regular rate and rhythm Abdomen: soft and non-tender  Impression & Plan: Assessment: 1.  Myopia ; Both Eyes (H52.13) 2.  CATARACT AGE-RELATED COMBINED FORMS; Right Eyes (H25.811)  Plan: 1.  Hold on new glasses for now  2.  Cataracts are visually significant and account for the patient's complaints. Discussed all risks, benefits, procedures and recovery, including infection,  loss of vision and eye, need for glasses after surgery or additional procedures. Patient understands changing glasses will not improve vision. Patient indicated understanding of procedure. All questions answered. Patient desires to have surgery, recommend phacoemulsification with intraocular lens. Patient to have preliminary testing necessary (Argos/IOL Master, Mac OCT, TOPO) Educational materials provided.  Plan: - Proceed with surgery OD - RayOne lens, target -0.25 - ok with lying flat - no fuchs, no DM - Omidria, ok for combo drop

## 2022-05-05 ENCOUNTER — Encounter (HOSPITAL_COMMUNITY)
Admission: RE | Admit: 2022-05-05 | Discharge: 2022-05-05 | Disposition: A | Discharge status: Home / Self Care | Payer: Medicare Other | Source: Ambulatory Visit | Attending: Optometry | Admitting: Optometry

## 2022-05-09 ENCOUNTER — Encounter (HOSPITAL_COMMUNITY): Payer: Self-pay | Admitting: Optometry

## 2022-05-12 ENCOUNTER — Encounter (HOSPITAL_COMMUNITY)
Admission: RE | Disposition: A | Discharge status: Home / Self Care | Payer: Self-pay | Source: Home / Self Care | Attending: Optometry

## 2022-05-12 ENCOUNTER — Encounter (HOSPITAL_COMMUNITY): Payer: Self-pay | Admitting: Optometry

## 2022-05-12 ENCOUNTER — Ambulatory Visit (HOSPITAL_COMMUNITY)
Admission: RE | Admit: 2022-05-12 | Discharge: 2022-05-12 | Disposition: A | Discharge status: Home / Self Care | Payer: Medicare Other | Source: Intra-hospital | Attending: Optometry | Admitting: Optometry

## 2022-05-12 ENCOUNTER — Ambulatory Visit (HOSPITAL_COMMUNITY): Payer: Medicare Other | Admitting: Anesthesiology

## 2022-05-12 ENCOUNTER — Ambulatory Visit (HOSPITAL_BASED_OUTPATIENT_CLINIC_OR_DEPARTMENT_OTHER): Payer: Medicare Other | Admitting: Anesthesiology

## 2022-05-12 DIAGNOSIS — I10 Essential (primary) hypertension: Secondary | ICD-10-CM | POA: Diagnosis not present

## 2022-05-12 DIAGNOSIS — Z09 Encounter for follow-up examination after completed treatment for conditions other than malignant neoplasm: Secondary | ICD-10-CM | POA: Insufficient documentation

## 2022-05-12 DIAGNOSIS — Z87891 Personal history of nicotine dependence: Secondary | ICD-10-CM | POA: Diagnosis not present

## 2022-05-12 DIAGNOSIS — H25811 Combined forms of age-related cataract, right eye: Secondary | ICD-10-CM

## 2022-05-12 DIAGNOSIS — H2511 Age-related nuclear cataract, right eye: Secondary | ICD-10-CM | POA: Insufficient documentation

## 2022-05-12 DIAGNOSIS — Z79899 Other long term (current) drug therapy: Secondary | ICD-10-CM | POA: Insufficient documentation

## 2022-05-12 DIAGNOSIS — F418 Other specified anxiety disorders: Secondary | ICD-10-CM

## 2022-05-12 DIAGNOSIS — K219 Gastro-esophageal reflux disease without esophagitis: Secondary | ICD-10-CM | POA: Diagnosis not present

## 2022-05-12 HISTORY — PX: CATARACT EXTRACTION W/PHACO: SHX586

## 2022-05-12 SURGERY — PHACOEMULSIFICATION, CATARACT, WITH IOL INSERTION
Anesthesia: Monitor Anesthesia Care | Site: Eye | Laterality: Right

## 2022-05-12 MED ORDER — BSS IO SOLN
INTRAOCULAR | Status: DC | PRN
  Administered 2022-05-12: 15 mL via INTRAOCULAR

## 2022-05-12 MED ORDER — PHENYLEPHRINE-KETOROLAC 1-0.3 % IO SOLN
INTRAOCULAR | Status: DC | PRN
  Administered 2022-05-12: 500 mL via OPHTHALMIC

## 2022-05-12 MED ORDER — MIDAZOLAM HCL 2 MG/2ML IJ SOLN
INTRAMUSCULAR | Status: AC
  Filled 2022-05-12: qty 2

## 2022-05-12 MED ORDER — PHENYLEPHRINE HCL 2.5 % OP SOLN
1.0000 [drp] | OPHTHALMIC | Status: AC
  Administered 2022-05-12 (×3): 1 [drp] via OPHTHALMIC

## 2022-05-12 MED ORDER — LIDOCAINE HCL (PF) 1 % IJ SOLN
INTRAMUSCULAR | Status: DC | PRN
  Administered 2022-05-12: 1 mL

## 2022-05-12 MED ORDER — TETRACAINE HCL 0.5 % OP SOLN
1.0000 [drp] | OPHTHALMIC | Status: AC
  Administered 2022-05-12 (×3): 1 [drp] via OPHTHALMIC

## 2022-05-12 MED ORDER — MIDAZOLAM HCL 2 MG/2ML IJ SOLN
INTRAMUSCULAR | Status: DC | PRN
  Administered 2022-05-12: 2 mg via INTRAVENOUS

## 2022-05-12 MED ORDER — POVIDONE-IODINE 5 % OP SOLN
OPHTHALMIC | Status: DC | PRN
  Administered 2022-05-12: 1 via OPHTHALMIC

## 2022-05-12 MED ORDER — SIGHTPATH DOSE#1 NA HYALUR & NA CHOND-NA HYALUR IO KIT
PACK | INTRAOCULAR | Status: DC | PRN
  Administered 2022-05-12: 1 via OPHTHALMIC

## 2022-05-12 MED ORDER — PHENYLEPHRINE-KETOROLAC 1-0.3 % IO SOLN
INTRAOCULAR | Status: AC
  Filled 2022-05-12: qty 4

## 2022-05-12 MED ORDER — NEOMYCIN-POLYMYXIN-DEXAMETH 3.5-10000-0.1 OP SUSP
OPHTHALMIC | Status: DC | PRN
  Administered 2022-05-12: 2 [drp] via OPHTHALMIC

## 2022-05-12 MED ORDER — LIDOCAINE HCL 3.5 % OP GEL
1.0000 | Freq: Once | OPHTHALMIC | Status: AC
  Administered 2022-05-12: 1 via OPHTHALMIC

## 2022-05-12 MED ORDER — STERILE WATER FOR IRRIGATION IR SOLN
Status: DC | PRN
  Administered 2022-05-12: 25 mL

## 2022-05-12 MED ORDER — TROPICAMIDE 1 % OP SOLN
1.0000 [drp] | OPHTHALMIC | Status: AC
  Administered 2022-05-12 (×3): 1 [drp] via OPHTHALMIC

## 2022-05-12 SURGICAL SUPPLY — 14 items
CATARACT SUITE SIGHTPATH (MISCELLANEOUS) ×1 IMPLANT
CLOTH BEACON ORANGE TIMEOUT ST (SAFETY) ×1 IMPLANT
DRSG TEGADERM 4X4.75 (GAUZE/BANDAGES/DRESSINGS) ×1 IMPLANT
EYE SHIELD UNIVERSAL CLEAR (GAUZE/BANDAGES/DRESSINGS) IMPLANT
FEE CATARACT SUITE SIGHTPATH (MISCELLANEOUS) ×1 IMPLANT
GLOVE BIOGEL PI IND STRL 7.0 (GLOVE) ×2 IMPLANT
LENS IOL RAYNER 15.0 (Intraocular Lens) ×1 IMPLANT
LENS IOL RAYONE EMV 15.0 (Intraocular Lens) IMPLANT
NDL HYPO 18GX1.5 BLUNT FILL (NEEDLE) ×1 IMPLANT
NEEDLE HYPO 18GX1.5 BLUNT FILL (NEEDLE) ×1 IMPLANT
PAD ARMBOARD 7.5X6 YLW CONV (MISCELLANEOUS) ×1 IMPLANT
SYR TB 1ML LL NO SAFETY (SYRINGE) ×1 IMPLANT
TAPE SURG TRANSPORE 1 IN (GAUZE/BANDAGES/DRESSINGS) IMPLANT
WATER STERILE IRR 250ML POUR (IV SOLUTION) ×1 IMPLANT

## 2022-05-12 NOTE — Op Note (Signed)
Date of procedure: 05/12/22  Pre-operative diagnosis: Visually significant age-related nuclear cataract, Right Eye (H25.11)  Post-operative diagnosis: Visually significant age-related nuclear cataract, Right Eye  Procedure: Removal of cataract via phacoemulsification and insertion of intra-ocular lens Rayner [id number] +15.0D into the capsular bag of the Right Eye  Attending surgeon: Ronn Melena, MD  Anesthesia: MAC, Topical Akten  Complications: None  Estimated Blood Loss: <72mL (minimal)  Specimens: None  Implants:  Implant Name Type Inv. Item Serial No. Manufacturer Lot No. LRB No. Used Action  LENS IOL RAYNER 15.0 - S01 Intraocular Lens LENS IOL RAYNER 15.0 01 [employer] [id number] Right 1 Implanted    Indications:  Visually significant age-related cataract, Right Eye  Procedure:  The patient was seen and identified in the pre-operative area. The operative eye was identified and dilated.  The operative eye was marked.  Topical anesthesia was administered to the operative eye.     The patient was then to the operative suite and placed in the supine position.  A timeout was performed confirming the patient, procedure to be performed, and all other relevant information.   The patient's face was prepped and draped in the usual fashion for intra-ocular surgery.  A lid speculum was placed into the operative eye and the surgical microscope moved into place and focused.  A superotemporal paracentesis was created using a 20 gauge paracentesis blade.  BSS mixed with Omidria, followed by 1% lidocaine was injected into the anterior chamber.  Viscoelastic was injected into the anterior chamber.  A temporal clear-corneal main wound incision was created using a 2.55mm microkeratome.  A continuous curvilinear capsulorrhexis was initiated using an irrigating cystitome and completed using capsulorrhexis forceps.  Hydrodissection and hydrodeliniation were performed.  Viscoelastic was injected into the  anterior chamber.  A phacoemulsification handpiece and a chopper as a second instrument were used to remove the nucleus and epinucleus. The irrigation/aspiration handpiece was used to remove any remaining cortical material.   The capsular bag was reinflated with viscoelastic, checked, and found to be intact.  The intraocular lens was inserted into the capsular bag.  The irrigation/aspiration handpiece was used to remove any remaining viscoelastic.  The clear corneal wound and paracentesis wounds were then hydrated and checked with Weck-Cels to be watertight.  The lid-speculum and drape was removed, and the patient's face was cleaned with a wet and dry 4x4.  Moxifloxacin drops were instilled onto the eye. A clear shield was taped over the eye. The patient was taken to the post-operative care unit in good condition, having tolerated the procedure well.  Post-Op Instructions: The patient will follow up at [facility] for a same day post-operative evaluation and will receive all other orders and instructions.

## 2022-05-12 NOTE — Transfer of Care (Signed)
Immediate Anesthesia Transfer of Care Note  Patient: Janice Stephens  Procedure(s) Performed: CATARACT EXTRACTION PHACO AND INTRAOCULAR LENS PLACEMENT (IOC) (Right: Eye)  Patient Location: Short Stay  Anesthesia Type:MAC  Level of Consciousness: awake, alert , and oriented  Airway & Oxygen Therapy: Patient Spontanous Breathing  Post-op Assessment: Report given to RN and Post -op Vital signs reviewed and stable  Post vital signs: Reviewed and stable  Last Vitals:  Vitals Value Taken Time  BP 128/91 05/12/22 1323  Temp 36.4 C 05/12/22 1323  Pulse 87 05/12/22 1323  Resp 16 05/12/22 1323  SpO2 99 % 05/12/22 1323    Last Pain:  Vitals:   05/12/22 1323  TempSrc: Oral  PainSc: 0-No pain         Complications: No notable events documented.

## 2022-05-12 NOTE — Anesthesia Preprocedure Evaluation (Signed)
Anesthesia Evaluation  Patient identified by MRN, date of birth, ID band Patient awake    Reviewed: Allergy & Precautions, H&P , NPO status , Patient's Chart, lab work & pertinent test results  Airway Mallampati: II  TM Distance: >3 FB Neck ROM: Full    Dental  (+) Dental Advisory Given, Teeth Intact   Pulmonary neg pulmonary ROS, former smoker   Pulmonary exam normal breath sounds clear to auscultation       Cardiovascular hypertension, Pt. on medications Normal cardiovascular exam Rhythm:Regular Rate:Normal     Neuro/Psych  PSYCHIATRIC DISORDERS Anxiety Depression    negative neurological ROS     GI/Hepatic Neg liver ROS,GERD  Medicated,,  Endo/Other  negative endocrine ROS    Renal/GU negative Renal ROS  negative genitourinary   Musculoskeletal negative musculoskeletal ROS (+)    Abdominal   Peds negative pediatric ROS (+)  Hematology negative hematology ROS (+)   Anesthesia Other Findings   Reproductive/Obstetrics negative OB ROS                             Anesthesia Physical Anesthesia Plan  ASA: 2  Anesthesia Plan: MAC   Post-op Pain Management: Minimal or no pain anticipated   Induction: Intravenous  PONV Risk Score and Plan: Treatment may vary due to age or medical condition  Airway Management Planned: Nasal Cannula and Natural Airway  Additional Equipment:   Intra-op Plan:   Post-operative Plan:   Informed Consent: I have reviewed the patients History and Physical, chart, labs and discussed the procedure including the risks, benefits and alternatives for the proposed anesthesia with the patient or authorized representative who has indicated his/her understanding and acceptance.     Dental advisory given  Plan Discussed with: CRNA and Surgeon  Anesthesia Plan Comments:         Anesthesia Quick Evaluation

## 2022-05-12 NOTE — Interval H&P Note (Signed)
History and Physical Interval Note:  The H and P was reviewed and updated. The patient was examined.  No changes were found after exam.  The surgical eye was marked.   Janice Stephens   

## 2022-05-12 NOTE — Anesthesia Postprocedure Evaluation (Signed)
Anesthesia Post Note  Patient: Janice Stephens  Procedure(s) Performed: CATARACT EXTRACTION PHACO AND INTRAOCULAR LENS PLACEMENT (IOC) (Right: Eye)  Patient location during evaluation: Phase II Anesthesia Type: MAC Level of consciousness: awake and alert and oriented Pain management: pain level controlled Vital Signs Assessment: post-procedure vital signs reviewed and stable Respiratory status: spontaneous breathing, nonlabored ventilation and respiratory function stable Cardiovascular status: blood pressure returned to baseline and stable Postop Assessment: no apparent nausea or vomiting Anesthetic complications: no  No notable events documented.   Last Vitals:  Vitals:   05/12/22 1225 05/12/22 1323  BP: 139/85 (!) 128/91  Pulse: 81 87  Resp: 14 16  Temp: 36.4 C 36.4 C  SpO2: 99% 99%    Last Pain:  Vitals:   05/12/22 1323  TempSrc: Oral  PainSc: 0-No pain                 Lamoyne Hessel C Wei Newbrough

## 2022-05-12 NOTE — Discharge Instructions (Signed)
Please discharge patient when stable, will follow up today with Dr. Merilyn Pagan at the [facility] [place] office immediately following discharge.  Leave shield in place until visit.  All paperwork with discharge instructions will be given at the office.  [facility] Address:  [address]  , [place] [address]  Dr. Lavita Pontius's Phone: [phone]  

## 2022-05-24 ENCOUNTER — Encounter (HOSPITAL_COMMUNITY): Payer: Self-pay | Admitting: Optometry

## 2022-07-09 ENCOUNTER — Other Ambulatory Visit (INDEPENDENT_AMBULATORY_CARE_PROVIDER_SITE_OTHER): Payer: Self-pay | Admitting: Gastroenterology

## 2022-07-09 DIAGNOSIS — K219 Gastro-esophageal reflux disease without esophagitis: Secondary | ICD-10-CM

## 2022-08-25 ENCOUNTER — Other Ambulatory Visit: Payer: Self-pay | Admitting: Internal Medicine

## 2022-08-25 DIAGNOSIS — Z1231 Encounter for screening mammogram for malignant neoplasm of breast: Secondary | ICD-10-CM

## 2022-09-20 ENCOUNTER — Other Ambulatory Visit (HOSPITAL_COMMUNITY): Payer: Self-pay | Admitting: Internal Medicine

## 2022-09-20 DIAGNOSIS — R1011 Right upper quadrant pain: Secondary | ICD-10-CM

## 2022-10-02 ENCOUNTER — Ambulatory Visit
Admission: RE | Admit: 2022-10-02 | Discharge: 2022-10-02 | Disposition: A | Discharge status: Home / Self Care | Payer: Medicare Other | Source: Ambulatory Visit | Attending: Internal Medicine | Admitting: Internal Medicine

## 2022-10-02 DIAGNOSIS — Z1231 Encounter for screening mammogram for malignant neoplasm of breast: Secondary | ICD-10-CM

## 2022-11-03 ENCOUNTER — Ambulatory Visit (HOSPITAL_COMMUNITY)
Admission: RE | Admit: 2022-11-03 | Discharge: 2022-11-03 | Disposition: A | Discharge status: Home / Self Care | Payer: Medicare Other | Source: Ambulatory Visit | Attending: Internal Medicine | Admitting: Internal Medicine

## 2022-11-03 DIAGNOSIS — R1011 Right upper quadrant pain: Secondary | ICD-10-CM

## 2022-11-03 MED ORDER — IOHEXOL 300 MG/ML  SOLN
100.0000 mL | Freq: Once | INTRAMUSCULAR | Status: AC | PRN
  Administered 2022-11-03: 100 mL via INTRAVENOUS

## 2022-11-03 MED ORDER — IOHEXOL 9 MG/ML PO SOLN
ORAL | Status: AC
  Filled 2022-11-03: qty 1000

## 2023-01-17 ENCOUNTER — Encounter: Payer: Self-pay | Admitting: Internal Medicine

## 2023-01-17 ENCOUNTER — Ambulatory Visit: Payer: Medicare Other | Attending: Internal Medicine | Admitting: Internal Medicine

## 2023-01-17 VITALS — BP 138/88 | HR 87 | Ht 62.4 in | Wt 148.2 lb

## 2023-01-17 DIAGNOSIS — E785 Hyperlipidemia, unspecified: Secondary | ICD-10-CM | POA: Insufficient documentation

## 2023-01-17 DIAGNOSIS — R0789 Other chest pain: Secondary | ICD-10-CM | POA: Diagnosis not present

## 2023-01-17 DIAGNOSIS — Z136 Encounter for screening for cardiovascular disorders: Secondary | ICD-10-CM | POA: Diagnosis not present

## 2023-01-17 DIAGNOSIS — I10 Essential (primary) hypertension: Secondary | ICD-10-CM | POA: Diagnosis not present

## 2023-01-17 DIAGNOSIS — R079 Chest pain, unspecified: Secondary | ICD-10-CM

## 2023-01-17 DIAGNOSIS — E7849 Other hyperlipidemia: Secondary | ICD-10-CM

## 2023-01-17 NOTE — Patient Instructions (Signed)
Medication Instructions:  Your physician recommends that you continue on your current medications as directed. Please refer to the Current Medication list given to you today.  *If you need a refill on your cardiac medications before your next appointment, please call your pharmacy*   Lab Work: None If you have labs (blood work) drawn today and your tests are completely normal, you will receive your results only by: MyChart Message (if you have MyChart) OR A paper copy in the mail If you have any lab test that is abnormal or we need to change your treatment, we will call you to review the results.   Testing/Procedures: Your physician has requested that you have en exercise stress myoview. For further information please visit [email or web address] Please follow instruction sheet, as given.    Follow-Up: At [facility], you and your health needs are our priority.  As part of our continuing mission to provide you with exceptional heart care, we have created designated Provider Care Teams.  These Care Teams include your primary Cardiologist (physician) and Advanced Practice Providers (APPs -  Physician Assistants and Nurse Practitioners) who all work together to provide you with the care you need, when you need it.  We recommend signing up for the patient portal called "MyChart".  Sign up information is provided on this After Visit Summary.  MyChart is used to connect with patients for Virtual Visits (Telemedicine).  Patients are able to view lab/test results, encounter notes, upcoming appointments, etc.  Non-urgent messages can be sent to your provider as well.   To learn more about what you can do with MyChart, go to [email or web address].    Your next appointment:    Follow up is pending testing results  Provider:   You may see Vishnu P Mallipeddi, MD or one of the following Advanced Practice Providers on your designated Care Team:   [place], PA-C  Jacolyn Reedy, [place]      Other Instructions

## 2023-01-17 NOTE — Progress Notes (Signed)
Cardiology Office Note  Date: 01/17/2023   ID: Janice Stephens, DOB [birth year], MRN [id number]  PCP:  Elfredia Nevins, MD  Cardiologist:  Marjo Bicker, MD Electrophysiologist:  None   History of Present Illness: Janice Stephens is a 72 y.o. female known to have HTN, HLD, GERD was referred to cardiology clinic for evaluation of chest pain.  Ongoing sharp stabbing chest pain for the last few months, lasting for few seconds to minutes, occurs only at rest and resolved spontaneously.  She recently had substernal chest heaviness lasting for some time.  This was also at rest.  Otherwise, she is very active at baseline and did not report any chest pain with exercise.  No DOE, orthopnea, PND, dizziness, syncope, leg swelling.  Compliant with medications and has no side effects.  His chest pains occur usually in the daytime or afternoon.  Does not think his GERD.    Past Medical History:  Diagnosis Date   GERD (gastroesophageal reflux disease)    Hyperlipidemia    Hypertension    Insomnia    Major depressive disorder     Past Surgical History:  Procedure Laterality Date   ABDOMINAL HYSTERECTOMY     APPENDECTOMY     CATARACT EXTRACTION W/PHACO Left 04/28/2022   Procedure: CATARACT EXTRACTION PHACO AND INTRAOCULAR LENS PLACEMENT (IOC);  Surgeon: Pecolia Ades, MD;  Location: [facility];  Service: Ophthalmology;  Laterality: Left;  CDE: 7.71   CATARACT EXTRACTION W/PHACO Right 05/12/2022   Procedure: CATARACT EXTRACTION PHACO AND INTRAOCULAR LENS PLACEMENT (IOC);  Surgeon: Pecolia Ades, MD;  Location: [facility];  Service: Ophthalmology;  Laterality: Right;  CDE: 6.17   CHOLECYSTECTOMY     COLONOSCOPY  2012   COLONOSCOPY WITH PROPOFOL N/A 10/04/2018   Procedure: COLONOSCOPY WITH PROPOFOL;  Surgeon: Malissa Hippo, MD;  Location: [facility];  Service: Endoscopy;  Laterality: N/A;   ESOPHAGOGASTRODUODENOSCOPY (EGD) WITH PROPOFOL N/A 10/04/2018   Procedure:  ESOPHAGOGASTRODUODENOSCOPY (EGD) WITH PROPOFOL;  Surgeon: Malissa Hippo, MD;  Location: [facility];  Service: Endoscopy;  Laterality: N/A;  9:30   ORTHOPEDIC SURGERY     right and left knee- Arthroscopic   TENDON REPAIR Right    Elbow   TONSILLECTOMY     TONSILLECTOMY     UPPER GASTROINTESTINAL ENDOSCOPY      Current Outpatient Medications  Medication Sig Dispense Refill   acetaminophen (TYLENOL) 500 MG tablet Take 500 mg by mouth daily as needed for moderate pain or headache.     acyclovir (ZOVIRAX) 400 MG tablet Take 400 mg by mouth daily.     ALPRAZolam (XANAX) 0.5 MG tablet Take 0.5 mg by mouth 3 (three) times daily as needed for anxiety or sleep.      amLODipine (NORVASC) 5 MG tablet Take 5 mg by mouth daily.     Ascorbic Acid (VITAMIN C) 500 MG CAPS Take 1 capsule by mouth daily.     famotidine (PEPCID) 20 MG tablet Take 1 tablet (20 mg total) by mouth at bedtime as needed for heartburn or indigestion.     Magnesium 500 MG CAPS Take 1 capsule by mouth daily.     melatonin 3 MG TABS tablet Take 3 mg by mouth at bedtime.     meloxicam (MOBIC) 7.5 MG tablet Take 7.5 mg by mouth daily with supper.      mirtazapine (REMERON SOL-TAB) 30 MG disintegrating tablet Take 45 mg by mouth at bedtime.  11   mirtazapine (REMERON) 15 MG tablet  Take 15 mg by mouth daily.     Misc Natural Products (TURMERIC CURCUMIN) CAPS Take 1 capsule by mouth daily. 1000mg      Oxymetazoline HCl 0.05 % GEL Place into the nose daily.     pantoprazole (PROTONIX) 40 MG tablet TAKE 1 TABLET BY MOUTH EVERY DAY 90 tablet 1   Polyethylene Glycol 3350 (MIRALAX PO) Take by mouth. Patient states that she uses 2 teaspoons daily.     pravastatin (PRAVACHOL) 20 MG tablet Take 20 mg by mouth every evening.      psyllium (METAMUCIL) 58.6 % powder Take 1 packet by mouth daily.     Pyridoxine HCl (VITAMIN B6) 100 MG TABS Take 1 tablet by mouth daily.     Quercetin 500 MG CAPS Take 1 capsule by mouth daily.     Vitamin  D-Vitamin K (VITAMIN K2-VITAMIN D3 PO) Take 90-125 mcg by mouth daily.     Zinc 50 MG TABS Take 1 tablet by mouth daily.     No current facility-administered medications for this visit.   Allergies:  Doxycycline, Sulfonamide derivatives, and Zolpidem tartrate   Social History: The patient  reports that she quit smoking about 18 years ago. Her smoking use included cigarettes. She started smoking about 23 years ago. She has a 5 pack-year smoking history. She has never used smokeless tobacco. She reports current alcohol use. She reports that she does not use drugs.   Family History: The patient's family history includes AAA (abdominal aortic aneurysm) in her mother; Cancer in her mother; Colon cancer in her maternal grandfather; Diabetes in her father; Heart attack in her father and paternal grandfather; Heart disease in her father and mother; Melanoma in her father.   ROS:  Please see the history of present illness. Otherwise, complete review of systems is positive for none.  All other systems are reviewed and negative.   Physical Exam: VS:  BP 138/88 (BP Location: Left Arm, Patient Position: Sitting, Cuff Size: Normal)   Pulse 87   Ht 5' 2.4" (1.585 m)   Wt 148 lb 3.2 oz (67.2 kg)   SpO2 95%   BMI 26.76 kg/m , BMI Body mass index is 26.76 kg/m.  Wt Readings from Last 3 Encounters:  01/17/23 148 lb 3.2 oz (67.2 kg)  05/12/22 149 lb 14.6 oz (68 kg)  04/28/22 149 lb 14.6 oz (68 kg)    General: Patient appears comfortable at rest. HEENT: Conjunctiva and lids normal, oropharynx clear with moist mucosa. Neck: Supple, no elevated JVP or carotid bruits, no thyromegaly. Lungs: Clear to auscultation, nonlabored breathing at rest. Cardiac: Regular rate and rhythm, no S3 or significant systolic murmur, no pericardial rub. Abdomen: Soft, nontender, no hepatomegaly, bowel sounds present, no guarding or rebound. Extremities: No pitting edema, distal pulses 2+. Skin: Warm and dry. Musculoskeletal:  No kyphosis. Neuropsychiatric: Alert and oriented x3, affect grossly appropriate.  Recent Labwork: No results found for requested labs within last 365 days.  No results found for: "CHOL", "TRIG", "HDL", "CHOLHDL", "VLDL", "LDLCALC", "LDLDIRECT"   Assessment and Plan:  Atypical chest pain: Sharp stabbing chest pains at rest for the last few months and substernal chest heaviness at rest a few days ago.  No chest pain with exertion.  Ischemia unlikely.  Due to advanced age and risk factors of HTN, will obtain exercise Myoview.  HTN, controlled: Continue current antihypertensives, amlodipine 5 mg once daily, follows with PCP.  HLD, unknown values: Continue pravastatin 20 mg at bedtime, goal LDL less than 100.  I spent a total duration of 45 minutes reviewing the prior notes, medications, discussed types of chest pain, pathophysiology, evaluation, management, ordered test and documented the findings in the note.  Medication Adjustments/Labs and Tests Ordered: Current medicines are reviewed at length with the patient today.  Concerns regarding medicines are outlined above.    Disposition:  Follow up pending results  Signed, Yolette Hastings Verne Spurr, MD, 01/17/2023 2:56 PM    [facility] [address] [place], [place] [address]

## 2023-01-24 ENCOUNTER — Ambulatory Visit (HOSPITAL_COMMUNITY)
Admission: RE | Admit: 2023-01-24 | Discharge: 2023-01-24 | Disposition: A | Discharge status: Home / Self Care | Payer: Medicare Other | Source: Ambulatory Visit | Attending: Internal Medicine | Admitting: Internal Medicine

## 2023-01-24 ENCOUNTER — Encounter (HOSPITAL_COMMUNITY): Payer: Self-pay

## 2023-01-24 ENCOUNTER — Encounter (HOSPITAL_COMMUNITY)
Admission: RE | Admit: 2023-01-24 | Discharge: 2023-01-24 | Disposition: A | Discharge status: Home / Self Care | Payer: Medicare Other | Source: Ambulatory Visit | Attending: Internal Medicine | Admitting: Internal Medicine

## 2023-01-24 DIAGNOSIS — R079 Chest pain, unspecified: Secondary | ICD-10-CM | POA: Insufficient documentation

## 2023-01-24 LAB — NM MYOCAR MULTI W/SPECT W/WALL MOTION / EF
Angina Index: 0
Duke Treadmill Score: 4
Estimated workload: 7
Exercise duration (min): 4 min
Exercise duration (sec): 28 s
LV dias vol: 47 mL (ref 46–106)
LV sys vol: 12 mL
MPHR: 148 {beats}/min
Nuc Stress EF: 75 %
Peak HR: 136 {beats}/min
Percent HR: 91 %
RATE: 0.2
RPE: 15
Rest HR: 74 {beats}/min
Rest Nuclear Isotope Dose: 10.5 mCi
SDS: 1
SRS: 1
SSS: 2
ST Depression (mm): 0 mm
Stress Nuclear Isotope Dose: 33 mCi
TID: 1.02

## 2023-01-24 MED ORDER — TECHNETIUM TC 99M TETROFOSMIN IV KIT
30.0000 | PACK | Freq: Once | INTRAVENOUS | Status: AC | PRN
  Administered 2023-01-24: 33 via INTRAVENOUS

## 2023-01-24 MED ORDER — SODIUM CHLORIDE FLUSH 0.9 % IV SOLN
INTRAVENOUS | Status: AC
  Administered 2023-01-24: 10 mL via INTRAVENOUS
  Filled 2023-01-24: qty 10

## 2023-01-24 MED ORDER — TECHNETIUM TC 99M TETROFOSMIN IV KIT
10.0000 | PACK | Freq: Once | INTRAVENOUS | Status: AC | PRN
  Administered 2023-01-24: 10.5 via INTRAVENOUS

## 2023-01-24 MED ORDER — REGADENOSON 0.4 MG/5ML IV SOLN
INTRAVENOUS | Status: AC
  Filled 2023-01-24: qty 5

## 2023-03-20 ENCOUNTER — Ambulatory Visit (INDEPENDENT_AMBULATORY_CARE_PROVIDER_SITE_OTHER): Payer: Medicare Other | Admitting: Gastroenterology

## 2023-03-20 ENCOUNTER — Encounter (INDEPENDENT_AMBULATORY_CARE_PROVIDER_SITE_OTHER): Payer: Self-pay | Admitting: Gastroenterology

## 2023-03-20 VITALS — BP 130/87 | HR 78 | Temp 98.3°F | Ht 62.0 in | Wt 153.6 lb

## 2023-03-20 DIAGNOSIS — R1310 Dysphagia, unspecified: Secondary | ICD-10-CM

## 2023-03-20 DIAGNOSIS — K219 Gastro-esophageal reflux disease without esophagitis: Secondary | ICD-10-CM

## 2023-03-20 DIAGNOSIS — K7689 Other specified diseases of liver: Secondary | ICD-10-CM

## 2023-03-20 DIAGNOSIS — K581 Irritable bowel syndrome with constipation: Secondary | ICD-10-CM | POA: Diagnosis not present

## 2023-03-20 NOTE — Progress Notes (Signed)
Referring Provider: Elfredia Nevins, MD Primary Care Physician:  Elfredia Nevins, MD Primary GI Physician: Dr. Levon Hedger   Chief Complaint  Patient presents with   Abdominal Pain    Having RUQ pain for years. States she has had scans and cannot find the cause of the pain. Pain feels like a cramp and severe pain and last time had the pain it went down her left side. Wonders if it could be from liver cyst she has.    Gastroesophageal Reflux    Follow up on GERD. States most of the time it is under control. Takes pantoprazole every morning and famotidine if needed.    Irritable Bowel Syndrome    Follow up on IBS C. Takes miralax and metamucil daily.     HPI:   Janice Stephens is a 73 y.o. female with past medical history of GERD, hyperlipidemia, depression, IBS  Patient presenting today for follow up of GERD and IBS with constipation   Last seen January 2024, at that time reported having occasional episodes of heartburn, once or twice per month.  Take pantoprazole 40 mg in the morning.  Taking Pepcid as needed.  Having some bloating which she takes Gas-X for as needed which helps.  Recommended check celiac panel, continue pantoprazole 40 mg daily, take Pepcid as needed, take Gas-X as needed for bloating  Celiac panel on 03/20/22 was negative   Present: Doing well today. GERD well controlled on protonix 40mg  daily, occasionally maybe a few times per month she will need to take famotiidne which seems to help. She tries to avoid overeating.occasionally will note that food may pass down slower, she does not feel this is occurring very often. Pills sometimes tend to cause more issues, especially larger pills. She occasionally strangles on liquids but feels this may be due to not paying attention when she drinks.   She has ongoing constipation. Taking metamucil and miralax daily (2T of each). She states even if she misses a dose, she will have issues. Usually having a BM daily, sometimes will  skip a day. Feels she can pass her stools pretty easily on the metamucil and miralax. Denies rectal bleeding or melena.   She denies any abdominal pain but notes that she has what feels like a spasm just under her right breast. Pain has been ongoing since atleast 2018. She has been to the ER in the past for this as sometimes pain can be very severe. Pain at one point radiated down her right side. She states no precipitating factors, may go a year or two without pain, it occurs very infrequently and sporadically. Nothing seems to improve her pain when it occurs, pain can last up to 3 hours at times. She has had CT and US imaging on numerous occasions without any findings.    Last EGD: 10/04/2018, irregular Z-line at 35 cm, 2 cm hiatal hernia, otherwise normal Last Colonoscopy: 10/04/2018, diverticulosis, external/internal hemorrhoids.   Recommended repeat colonoscopy in 7 years   Past Medical History:  Diagnosis Date   GERD (gastroesophageal reflux disease)    Hyperlipidemia    Hypertension    Insomnia    Major depressive disorder     Past Surgical History:  Procedure Laterality Date   ABDOMINAL HYSTERECTOMY     APPENDECTOMY     CATARACT EXTRACTION W/PHACO Left 04/28/2022   Procedure: CATARACT EXTRACTION PHACO AND INTRAOCULAR LENS PLACEMENT (IOC);  Surgeon: Pecolia Ades, MD;  Location: [facility];  Service: Ophthalmology;  Laterality: Left;  CDE:  7.71   CATARACT EXTRACTION W/PHACO Right 05/12/2022   Procedure: CATARACT EXTRACTION PHACO AND INTRAOCULAR LENS PLACEMENT (IOC);  Surgeon: Pecolia Ades, MD;  Location: [facility];  Service: Ophthalmology;  Laterality: Right;  CDE: 6.17   CHOLECYSTECTOMY     COLONOSCOPY  2012   COLONOSCOPY WITH PROPOFOL N/A 10/04/2018   Procedure: COLONOSCOPY WITH PROPOFOL;  Surgeon: Malissa Hippo, MD;  Location: [facility];  Service: Endoscopy;  Laterality: N/A;   ESOPHAGOGASTRODUODENOSCOPY (EGD) WITH PROPOFOL N/A 10/04/2018   Procedure:  ESOPHAGOGASTRODUODENOSCOPY (EGD) WITH PROPOFOL;  Surgeon: Malissa Hippo, MD;  Location: [facility];  Service: Endoscopy;  Laterality: N/A;  9:30   ORTHOPEDIC SURGERY     right and left knee- Arthroscopic   TENDON REPAIR Right    Elbow   TONSILLECTOMY     TONSILLECTOMY     UPPER GASTROINTESTINAL ENDOSCOPY      Current Outpatient Medications  Medication Sig Dispense Refill   acetaminophen (TYLENOL) 500 MG tablet Take 500 mg by mouth daily as needed for moderate pain or headache.     acyclovir (ZOVIRAX) 400 MG tablet Take 400 mg by mouth daily.     ALPRAZolam (XANAX) 0.5 MG tablet Take 0.5 mg by mouth 3 (three) times daily as needed for anxiety or sleep.      amLODipine (NORVASC) 2.5 MG tablet Take 2.5 mg by mouth daily.     Ascorbic Acid (VITAMIN C) 500 MG CAPS Take 1 capsule by mouth daily.     famotidine (PEPCID) 20 MG tablet Take 1 tablet (20 mg total) by mouth at bedtime as needed for heartburn or indigestion.     Magnesium 500 MG CAPS Take 1 capsule by mouth daily.     melatonin 5 MG TABS Take 5 mg by mouth at bedtime.     meloxicam (MOBIC) 7.5 MG tablet Take 7.5 mg by mouth daily with supper.      mirtazapine (REMERON SOL-TAB) 30 MG disintegrating tablet Take 45 mg by mouth at bedtime.  11   mirtazapine (REMERON) 15 MG tablet Take 15 mg by mouth daily.     Misc Natural Products (TURMERIC CURCUMIN) CAPS Take 1 capsule by mouth daily. 1000mg      Oxymetazoline HCl 0.05 % GEL Place into the nose daily.     pantoprazole (PROTONIX) 40 MG tablet TAKE 1 TABLET BY MOUTH EVERY DAY 90 tablet 1   Polyethylene Glycol 3350 (MIRALAX PO) Take by mouth. Patient states that she uses 2 teaspoons daily.     pravastatin (PRAVACHOL) 20 MG tablet Take 20 mg by mouth every evening.      psyllium (METAMUCIL) 58.6 % powder Take 1 packet by mouth daily.     Pyridoxine HCl (VITAMIN B6) 100 MG TABS Take 1 tablet by mouth daily.     Quercetin 500 MG CAPS Take 1 capsule by mouth daily.     Vitamin  D-Vitamin K (VITAMIN K2-VITAMIN D3 PO) Take 90-125 mcg by mouth daily.     Zinc 50 MG TABS Take 1 tablet by mouth daily.     No current facility-administered medications for this visit.    Allergies as of 03/20/2023 - Review Complete 03/20/2023  Allergen Reaction Noted   Doxycycline  04/15/2010   Sulfonamide derivatives Nausea And Vomiting    Zolpidem tartrate  04/15/2010    Family History  Problem Relation Age of Onset   Heart attack Paternal Grandfather    Colon cancer Maternal Grandfather    Diabetes Father    Heart  disease Father    Heart attack Father    Melanoma Father    Cancer Mother    Heart disease Mother    AAA (abdominal aortic aneurysm) Mother    Breast cancer Neg Hx     Social History   Socioeconomic History   Marital status: Widowed    Spouse name: Not on file   Number of children: Not on file   Years of education: Not on file   Highest education level: Not on file  Occupational History   Not on file  Tobacco Use   Smoking status: Former    Current packs/day: 0.00    Average packs/day: 1 pack/day for 5.0 years (5.0 ttl pk-yrs)    Types: Cigarettes    Start date: 04/28/1999    Quit date: 04/27/2004    Years since quitting: 18.9   Smokeless tobacco: Never  Vaping Use   Vaping status: Never Used  Substance and Sexual Activity   Alcohol use: Yes    Comment: occasionally   Drug use: No   Sexual activity: Yes    Partners: Male    Birth control/protection: Post-menopausal, Surgical    Comment: sp hyst  Other Topics Concern   Not on file  Social History Narrative   Not on file   Social Drivers of Health   Financial Resource Strain: Low Risk  (03/29/2021)   Overall Financial Resource Strain (CARDIA)    Difficulty of Paying Living Expenses: Not hard at all  Food Insecurity: No Food Insecurity (03/29/2021)   Hunger Vital Sign    Worried About Running Out of Food in the Last Year: Never true    Ran Out of Food in the Last Year: Never true   Transportation Needs: No Transportation Needs (03/29/2021)   PRAPARE - Administrator, Civil Service (Medical): No    Lack of Transportation (Non-Medical): No  Physical Activity: Insufficiently Active (03/29/2021)   Exercise Vital Sign    Days of Exercise per Week: 2 days    Minutes of Exercise per Session: 10 min  Stress: Stress Concern Present (03/29/2021)   [employer] of Occupational Health - Occupational Stress Questionnaire    Feeling of Stress : Very much  Social Connections: Unknown (03/29/2021)   Social Connection and Isolation Panel [NHANES]    Frequency of Communication with Friends and Family: More than three times a week    Frequency of Social Gatherings with Friends and Family: More than three times a week    Attends Religious Services: Patient declined    Database administrator or Organizations: Yes    Attends Engineer, structural: More than 4 times per year    Marital Status: Living with partner    Review of systems General: negative for malaise, night sweats, fever, chills, weight loss Neck: Negative for lumps, goiter, pain and significant neck swelling Resp: Negative for cough, wheezing, dyspnea at rest CV: Negative for chest pain, leg swelling, palpitations, orthopnea GI: denies melena, hematochezia, nausea, vomiting, diarrhea, odyonophagia, early satiety or unintentional weight loss. +constipation +occasional dysphagia  MSK: Negative for joint pain or swelling, back pain, and muscle pain. +pain under Right breast  The remainder of the review of systems is noncontributory.  Physical Exam: There were no vitals taken for this visit. General:   Alert and oriented. No distress noted. Pleasant and cooperative.  Head:  Normocephalic and atraumatic. Eyes:  Conjuctiva clear without scleral icterus. Mouth:  Oral mucosa pink and moist. Good dentition. No lesions.  Heart: Normal rate and rhythm, s1 and s2 heart sounds present.  Lungs: Clear lung sounds  in all lobes. Respirations equal and unlabored. Abdomen:  +BS, soft, non-tender and non-distended. No rebound or guarding. No HSM or masses noted. Derm: No palmar erythema or jaundice Msk:  Symmetrical without gross deformities. Normal posture. Neurologic:  Alert and  oriented x4 Psych:  Alert and cooperative. Normal mood and affect.  Invalid input(s): "6 MONTHS"   ASSESSMENT: Janice Stephens is a 73 y.o. female presenting today for follow up of GERD and IBS with constipation   GERD: Well-controlled on Protonix 40 mg daily, reports she takes famotidine 20 mg as needed for breakthrough symptoms and wondered 2 times per month which works well for her.  She has some occasional dysphagia and reports this is usually worse with larger pills, though at this time she does not wish to undergo upper endoscopy as she does not feel that symptoms are severe enough nor occurring frequently enough for her to want to do this.  She will make me aware if her dysphagia worsens.  For now we will continue with current PPI regimen using H2 blocker as needed and good reflux precautions.  Constipation: Well-managed with MiraLAX and Metamucil daily.  Usually has a bowel movement once a day, only really has issues with harder stools or needing to strain if she misses a dose of her MiraLAX or Metamucil.  Encouraged her to try and increase water intake as well as maintaining diet high in fruits, veggies, whole grains and can continue with her current bowel regimen.  Right side pain: Patient reports some very infrequent, intermittent pain under her right breast that has been occurring on and off since at least 2018.  Multiple imaging modalities that have only showed some nonchanging cyst on the liver.  Notably on exam it appears her pain is more in the right rib area versus right upper abdomen, query if this is a muscle spasm.  She denies any associated GI symptoms with it.  Discussed with the patient that I do not feel this is  GI related and very unlikely related to the liver cyst that she has given location and infrequency of her pain.  PLAN:  - continue protonix 40mg  daily  -continue famotidine 20mg  PRN - reflux precautions  -Increase water intake, aim for atleast 64 oz per day Increase fruits, veggies and whole grains, kiwi and prunes are especially good for constipation -pt to let me know if dysphagia worsens, can proceed with EGD   All questions were answered, patient verbalized understanding and is in agreement with plan as outlined above.   Follow Up: 1 Year   Lashanda Storlie L. Jeanmarie Hubert, MSN, APRN, AGNP-C Adult-Gerontology Nurse Practitioner [facility]  I have reviewed the note and agree with the APP's assessment as described in this progress note  Katrinka Blazing, MD Gastroenterology and Hepatology [facility]

## 2023-03-20 NOTE — Patient Instructions (Signed)
-   continue protonix 40mg  daily  -famotidine 20mg  as needed for breakthrough symptoms  -Increase water intake, aim for atleast 64 oz per day -Increase fruits, veggies and whole grains, kiwi and prunes are especially good for constipation -pt to let me know if swallowing worsens, as we may need to schedule upper endoscopy   Follow up 1 year  It was a pleasure to see you today. I want to create trusting relationships with patients and provide genuine, compassionate, and quality care. I truly value your feedback! please be on the lookout for a survey regarding your visit with me today. I appreciate your input about our visit and your time in completing this!    Sera Hitsman L. Jeanmarie Hubert, MSN, APRN, AGNP-C Adult-Gerontology Nurse Practitioner [facility]astroenterology at [facility]

## 2023-08-20 ENCOUNTER — Other Ambulatory Visit: Payer: Self-pay | Admitting: Internal Medicine

## 2023-08-20 DIAGNOSIS — Z1231 Encounter for screening mammogram for malignant neoplasm of breast: Secondary | ICD-10-CM

## 2023-10-04 ENCOUNTER — Ambulatory Visit

## 2023-10-05 ENCOUNTER — Ambulatory Visit
Admission: RE | Admit: 2023-10-05 | Discharge: 2023-10-05 | Disposition: A | Discharge status: Home / Self Care | Source: Ambulatory Visit | Attending: Internal Medicine | Admitting: Internal Medicine

## 2023-10-05 DIAGNOSIS — Z1231 Encounter for screening mammogram for malignant neoplasm of breast: Secondary | ICD-10-CM

## 2023-10-09 ENCOUNTER — Other Ambulatory Visit (HOSPITAL_COMMUNITY): Payer: Self-pay | Admitting: Internal Medicine

## 2023-10-09 DIAGNOSIS — E559 Vitamin D deficiency, unspecified: Secondary | ICD-10-CM

## 2023-10-19 ENCOUNTER — Ambulatory Visit (HOSPITAL_COMMUNITY)
Admission: RE | Admit: 2023-10-19 | Discharge: 2023-10-19 | Disposition: A | Discharge status: Home / Self Care | Source: Ambulatory Visit | Attending: Internal Medicine | Admitting: Internal Medicine

## 2023-10-19 DIAGNOSIS — E559 Vitamin D deficiency, unspecified: Secondary | ICD-10-CM | POA: Diagnosis not present

## 2023-10-19 DIAGNOSIS — Z13820 Encounter for screening for osteoporosis: Secondary | ICD-10-CM | POA: Insufficient documentation

## 2023-10-19 DIAGNOSIS — Z780 Asymptomatic menopausal state: Secondary | ICD-10-CM | POA: Diagnosis not present

## 2023-10-19 DIAGNOSIS — M8589 Other specified disorders of bone density and structure, multiple sites: Secondary | ICD-10-CM | POA: Diagnosis not present

## 2023-12-05 ENCOUNTER — Encounter (INDEPENDENT_AMBULATORY_CARE_PROVIDER_SITE_OTHER): Payer: Self-pay | Admitting: Gastroenterology

## 2024-02-19 ENCOUNTER — Encounter (INDEPENDENT_AMBULATORY_CARE_PROVIDER_SITE_OTHER): Payer: Self-pay | Admitting: Gastroenterology

## 2024-03-19 ENCOUNTER — Ambulatory Visit (INDEPENDENT_AMBULATORY_CARE_PROVIDER_SITE_OTHER): Admitting: Gastroenterology
